# Patient Record
Sex: Male | Born: 1960 | Race: White | Hispanic: No | Marital: Married | Smoking: Never smoker
Health system: Southern US, Community
[De-identification: ages and names within clinical notes are randomized; demographics above are authoritative.]

## PROBLEM LIST (undated history)

## (undated) DIAGNOSIS — Z972 Presence of dental prosthetic device (complete) (partial): Secondary | ICD-10-CM

## (undated) DIAGNOSIS — H33313 Horseshoe tear of retina without detachment, bilateral: Secondary | ICD-10-CM

## (undated) DIAGNOSIS — R51 Headache: Secondary | ICD-10-CM

## (undated) DIAGNOSIS — Z87442 Personal history of urinary calculi: Secondary | ICD-10-CM

## (undated) DIAGNOSIS — M5416 Radiculopathy, lumbar region: Secondary | ICD-10-CM

## (undated) DIAGNOSIS — M1990 Unspecified osteoarthritis, unspecified site: Secondary | ICD-10-CM

## (undated) DIAGNOSIS — R55 Syncope and collapse: Secondary | ICD-10-CM

## (undated) DIAGNOSIS — F419 Anxiety disorder, unspecified: Secondary | ICD-10-CM

## (undated) DIAGNOSIS — S0590XA Unspecified injury of unspecified eye and orbit, initial encounter: Secondary | ICD-10-CM

## (undated) DIAGNOSIS — E871 Hypo-osmolality and hyponatremia: Secondary | ICD-10-CM

## (undated) DIAGNOSIS — G5602 Carpal tunnel syndrome, left upper limb: Secondary | ICD-10-CM

## (undated) DIAGNOSIS — R519 Headache, unspecified: Secondary | ICD-10-CM

## (undated) DIAGNOSIS — K319 Disease of stomach and duodenum, unspecified: Secondary | ICD-10-CM

## (undated) HISTORY — DX: Anxiety disorder, unspecified: F41.9

## (undated) HISTORY — PX: REFRACTIVE SURGERY: SHX103

---

## 1993-04-09 DIAGNOSIS — Z87442 Personal history of urinary calculi: Secondary | ICD-10-CM

## 1993-04-09 HISTORY — DX: Personal history of urinary calculi: Z87.442

## 1993-04-09 HISTORY — PX: CYSTOSCOPY W/ URETERAL STENT PLACEMENT: SHX1429

## 2006-01-09 ENCOUNTER — Emergency Department (HOSPITAL_COMMUNITY): Admission: EM | Admit: 2006-01-09 | Discharge: 2006-01-09 | Payer: Self-pay | Admitting: Emergency Medicine

## 2007-04-10 DIAGNOSIS — E871 Hypo-osmolality and hyponatremia: Secondary | ICD-10-CM

## 2007-04-10 DIAGNOSIS — K319 Disease of stomach and duodenum, unspecified: Secondary | ICD-10-CM

## 2007-04-10 HISTORY — DX: Hypo-osmolality and hyponatremia: E87.1

## 2007-04-10 HISTORY — DX: Disease of stomach and duodenum, unspecified: K31.9

## 2008-01-18 ENCOUNTER — Emergency Department (HOSPITAL_COMMUNITY): Admission: EM | Admit: 2008-01-18 | Discharge: 2008-01-18 | Payer: Self-pay | Admitting: Family Medicine

## 2008-01-18 ENCOUNTER — Emergency Department (HOSPITAL_COMMUNITY): Admission: EM | Admit: 2008-01-18 | Discharge: 2008-01-18 | Payer: Self-pay | Admitting: Emergency Medicine

## 2008-01-25 ENCOUNTER — Inpatient Hospital Stay (HOSPITAL_COMMUNITY): Admission: EM | Admit: 2008-01-25 | Discharge: 2008-01-29 | Payer: Self-pay | Admitting: Emergency Medicine

## 2008-01-25 ENCOUNTER — Ambulatory Visit: Payer: Self-pay | Admitting: *Deleted

## 2008-01-26 ENCOUNTER — Ambulatory Visit: Payer: Self-pay | Admitting: *Deleted

## 2010-01-08 IMAGING — CR DG ABDOMEN ACUTE W/ 1V CHEST
3 series · 3 of 3 positions shown · non-contrast
Comparison: Abdominal CT and acute abdominal series done earlier
today

CLINICAL DATA: Abdominal pain.  Constipation.

ACUTE ABDOMEN SERIES (ABDOMEN 2 VIEW & CHEST 1 VIEW)

[w chest pa]
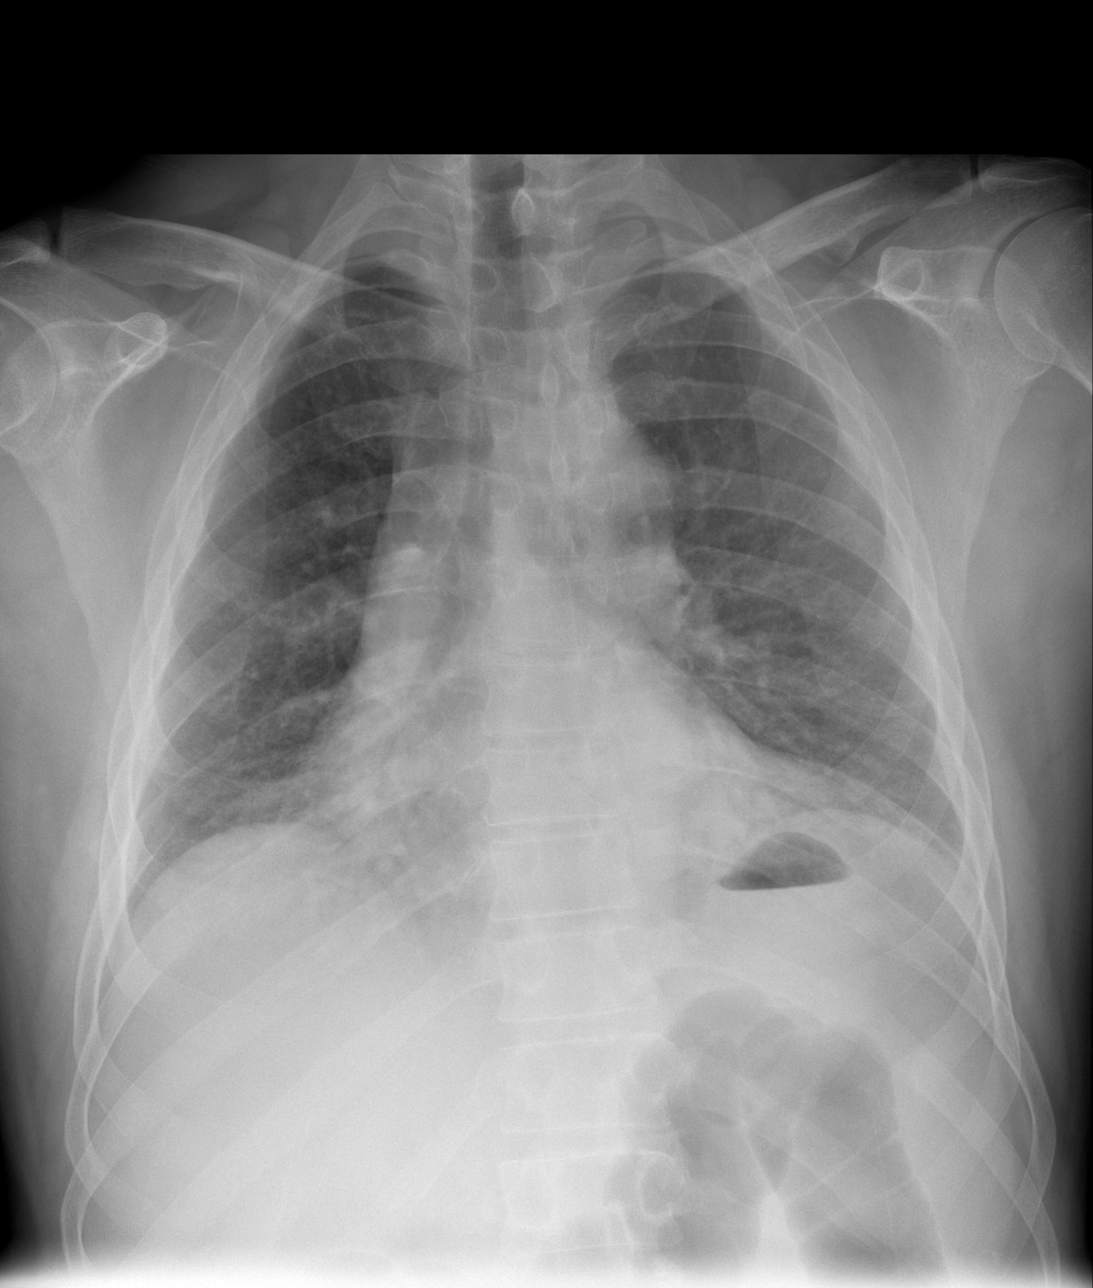

[w abdomen upright]
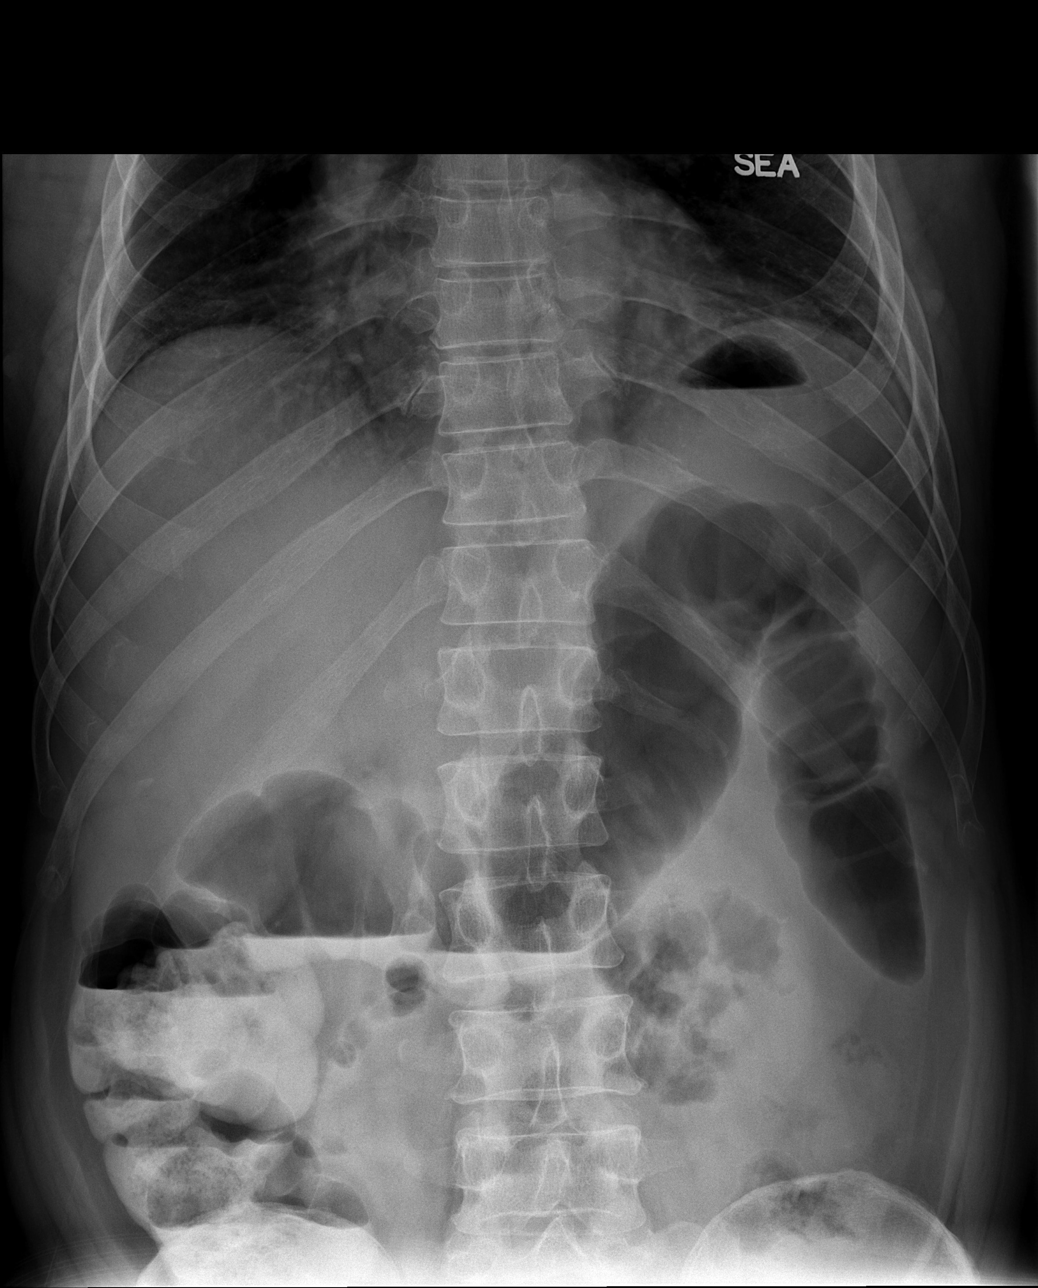

[t abdomen supine]
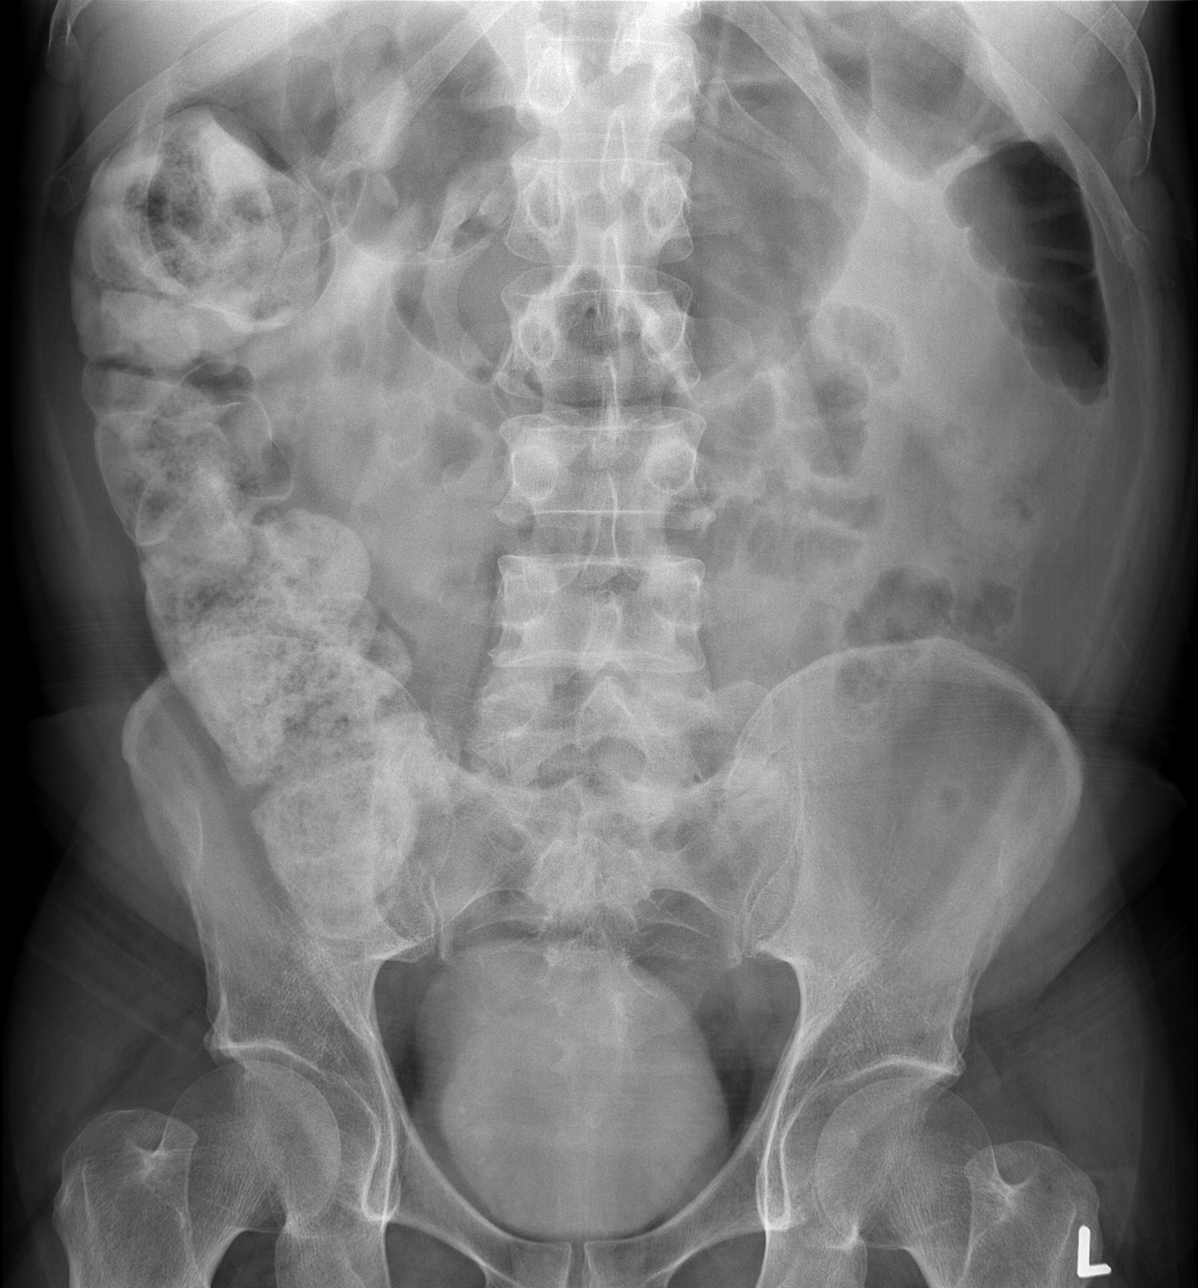

[3 of 3 positions shown; findings below may reference images not displayed]

FINDINGS: Frontal examination of the chest demonstrates stable
aortic ectasia and bibasilar atelectasis.

Supine and erect views of the abdomen demonstrate a nonobstructive
bowel gas pattern.  There is contrast material and stool in the
right colon.  There is contrast material in the urinary bladder.
There is no free intraperitoneal air.
IMPRESSION: No acute cardiopulmonary or abdominal process demonstrated.  Mild
bibasilar atelectasis.

## 2010-08-17 ENCOUNTER — Emergency Department (HOSPITAL_COMMUNITY)
Admission: EM | Admit: 2010-08-17 | Discharge: 2010-08-18 | Disposition: A | Discharge status: Home / Self Care | Payer: BC Managed Care – PPO | Attending: Emergency Medicine | Admitting: Emergency Medicine

## 2010-08-17 DIAGNOSIS — M79609 Pain in unspecified limb: Secondary | ICD-10-CM | POA: Insufficient documentation

## 2010-08-17 DIAGNOSIS — M5430 Sciatica, unspecified side: Secondary | ICD-10-CM | POA: Insufficient documentation

## 2010-08-17 DIAGNOSIS — M545 Low back pain, unspecified: Secondary | ICD-10-CM | POA: Insufficient documentation

## 2010-08-22 NOTE — Consult Note (Signed)
NAMETEAGEN, MCLEARY NO.:  [id number]   MEDICAL RECORD NO.:  [id number]          PATIENT TYPE:  INP   LOCATION:  6704                         FACILITY:  [facility]   PHYSICIAN:  Ardeth Sportsman, MD     DATE OF BIRTH:  1961-02-16   DATE OF CONSULTATION:  DATE OF DISCHARGE:                                 CONSULTATION   PRIMARY CARE PHYSICIAN:  Dr. Nicholos Johns with [facility].   CONSULTING PHYSICIAN:  Zadie Rhine, MD including [facility]  Emergency Department.   REASON FOR CONSULTATION:  Abdominal pain, nausea, vomiting, question  constipation.   HISTORY OF PRESENT ILLNESS:  [name] is a 50 year old male,  otherwise healthy with no history of any GI problems in the past to  about a week ago, had a 10/10 severe abdominal pain.  He came to the  emergency room last week.  A workup including a CAT scan and  laboratories values was negative.  He was given an oral laxative and had  large bowel movement, seemed to feel a little bit better.  Based on  improvement, he was sent home.  However, over the past week he started  having abdominal pain again.  He has been feeling nauseated  intermittently and thrown off.  He cannot recall if he has had a  significant bowel movement in the past week.  He went to an Urgent Care  Center.   The patient denies any dysphagia to solids or liquids.  No hematochezia,  hematemesis, or melena.  No history of heartburn or reflux.  No history  of sick contacts or travel history.  He is not having any really  significant change in diet.  Normally, he can eat anything he wants  without any evidence of postprandial pain or difficulty.  No history of  ulcers.  No burning or pain.  Not a heavy drinker at all.   Evaluation was concerning, and therefore, he came to the emergency  department and pain consultation was requested for possible surgical  etiology for his problems.   PAST MEDICAL HISTORY:  Negative.   PAST SURGICAL  HISTORY:  Negative.   ALLERGIES:  None.   MEDICATIONS:  He is just on glycerine and Dulcolax suppositories for the  past week, otherwise negative.   SOCIAL HISTORY:  No tobacco, alcohol, or drug use.  He is married and  lives with his family.   FAMILY HISTORY:  Diabetes and cancer, but no GI malignancies or cancers.  No family history of inflammatory bowel disease or irritable bowel  syndrome.   REVIEW OF SYSTEMS:  As noted per HPI.  CONSTITUTIONAL:  No fevers, chills, or sweats.  Otherwise, he had  decreased appetite.  No changes in weight.  PSYCHIATRIC/EYES/ENT/  CARDIAC/RESPIRATORY:  Negative.  HEME/LYMPH:  Allergic.  HEPATIC/RENAL/  ENDOCRINE:  Otherwise negative.  GI:  As noted per HPI.  MUSCULOSKELETAL/NEUROLOGIC:  Negative.  DERMATOLOGY:  Negative.   PHYSICAL EXAMINATION:  VITAL SIGNS:  Temperature 98.7, respirations 18,  pulses in 110s, and blood pressure 150-170s systolic over 88-104.  GENERAL:  He is a well-developed  obese male lying in bed.  In no acute  distress.  PSYCHIATRIC:  He just received some narcotics, so he is a little bit  confused when he answered, but after talking to him a couple of minutes  he seemed to be oriented x4.  No evidence of any dementia, delirium,  psychosis, or paranoia.  HEENT:  Eyes, his pupils are equal, round, and reactive to light.  Extraocular movements intact.  Sclerae slightly injected but not  icteric.  Normocephalic with no facial asymmetry.  Mucous membranes are  moist.  Nasopharynx and oropharynx clear.  NECK:  Supple.  No masses.  Trachea is midline.  LYMPH:  No head, neck, axillary, or groin lymphadenopathy.  HEART:  Regular rate and rhythm.  No murmurs, rubs, or gallops.  CHEST:  Clear to auscultation bilaterally.  No wheezes, rales, or  rhonchi.  No pain to rub or sternal compression.  ABDOMEN:  Obese but soft.  He has no focal tenderness to my exam.  When  I asked him, he kind of points to just all around his abdomen.  He   cannot give me a pinpoint area.  There is no Murphy sign.  He may have a  2-mm hernia within his umbilical stalk.  I think it is more physiologic  than a true hernia.  He has no guarding, no Rovsing sign or obturator  sign.  No pain to percussion or bed shake.  GENITOURINARY:  Normal external genitalia.  The testes descended  bilaterally.  He has a little fullness at his rings, but no definite  inguinal hernias.  RECTAL:  Refused.  EXTREMITIES:  No clubbing, cyanosis, or edema with normal radial and  dorsalis pedis pulses.  MUSCULOSKELETAL:  Full range of motion of the shoulders, elbows, wrists,  as well as hip, knees, and ankles.  BACK:  No pain on cervicothoracolumbosacral spine.  SKIN:  No petechiae.  No purpura.  No other source of lesions.   LABORATORY EVALUATION:  Sodium 117, electrolytes otherwise in normal  range.  Liver function tests amylase and lipase are normal.  His white  count is 11.9 which is up from 8 before, hemoglobin 16.4.   CT scan of the abdomen and pelvis shows a stable liver hemangioma.  There is no evidence of any bowel obstruction.  Contrast does get down  into the distal small bowel, but it has not reached in the colon yet.  The right colon still has some stool in it but the colon wall does not  appear to be thickened.  Left side of the transverse colon is dilated  with air, but again nonthickened.  The descending colon down to the  sigmoid is decompressed.  There is no obvious transition zone.  He has  no umbilical hernia.  Pelvis, no evidence of any diverticulitis or  discrete masses or lymphadenopathy.  He may have a little fullness along  his left inguinal canal, but hard to say that he truly has an inguinal  hernia.   ASSESSMENT AND PLAN:  A 50 year old obese male with intermittent  abdominal pain and some constipation, possible obstipation, nausea,  vomiting of uncertain etiology.   1. At this point, there is nothing surgically that is  definitively      going on but we will follow the patient.  I agree with the ER that      the patient needs to be admitted.  Dr. Flonnie Overman with [facility]      Hospitalists to evaluate and consider  admission, which I think is      reasonable given the severe hyponatremia.  2. Hyponatremia, we will refer to supplementation to follow.  3. Serial abdominal exams.  Consider upper and lower endoscopy to rule      out some type of colitis or other abnormality given the odd      symptoms.  4. Consider fever culture workup to see if perhaps he has a mild ileus      related to this.  5. Follow mental status on narcotics.  There is no evidence of any      psych issues on this patient, but he is not fully      with it right now.  We will follow.  I do not think he has strong      evidence of delirium but we will follow.  6. Hypertension and tachycardia most likely secondary to pain and      discomfort.  Follow and refer to medicine service.      Ardeth Sportsman, MD  Electronically Signed     SCG/MEDQ  D:  01/25/2008  T:  01/26/2008  Job:  [id number]   cc:   Sunny Schlein, PA, [facility]  Elana Alm. Nicholos Johns, M.D.  Lucita Ferrara, MD

## 2010-08-22 NOTE — H&P (Signed)
NAMELESLIE, [name]:  [id number]   MEDICAL RECORD NO.:  [id number]          PATIENT TYPE:  INP   LOCATION:  6704                         FACILITY:  [facility]   PHYSICIAN:  Lucita Ferrara, MD         DATE OF BIRTH:  [birth year]   DATE OF ADMISSION:  01/25/2008  DATE OF DISCHARGE:                              HISTORY & PHYSICAL   PRIMARY CARE DOCTOR:  Dr. Janace Litten at [facility].   The patient is a 50 year old male who presents to [facility] with a chief complaint of diffuse acute abdominal pain and  abdominal distention.  The patient had acute abdominal series suggestive  of colonic ileus versus obstruction.  His abdominal pain really started  a week ago and has been progressively getting worse to the point where  he just cannot any tolerate p.o. Nonradiating. Really located diffusely.  Currently it is 2/10 in intensity now. It was 10/10 in intensity. And he  denies any loose bloody dark stools.  He denies any vomiting.  He denies  any fevers, chills. He tried some laxatives without any relief.  He was  evaluated by surgical team, Dr. Michaell Cowing, here in the emergency room and  currently he has not had a surgical abdomen per surgical consultation.  He denies any chest pain, or shortness of breath.   ALLERGIES:  No known drug allergies.   MEDICATIONS AT HOME:  Include Dulcolax as needed and laxatives over-the-  counter as needed.   REVIEW OF SYSTEMS:  As per HPI, otherwise negative.   PAST MEDICAL HISTORY:  None.   PAST SURGICAL HISTORY:  None.   SOCIAL HISTORY:  Denies drugs, alcohol or tobacco. He lives with his  spouse.   PHYSICAL EXAMINATION:  GENERAL:  Patient is in acute discomfort and  distress.  HEENT: Normocephalic, atraumatic.  Sclerae are anicteric.  Mucous  membranes are dry.  NECK: Supple.  CARDIOVASCULAR:  S1-S2. Tachycardiac.  There are no murmurs, rubs or  clicks.  ABDOMEN:  Diffusely distended.  There are hypoactive bowel  sounds.  EXTREMITIES:  There is no clubbing, cyanosis or edema.  NEURO:  Patient is anxious but alert and oriented x3. Cranial nerves  grossly intact.   The patient was evaluated in the emergency room by surgical service of  Dr. Michaell Cowing who has determined the patient will likely not need acute  surgery now and to admit to medicine for CT scan of the abdomen and  pelvis.  Abdomen shows a 2 cm enhancing lesion in the dome of the left  lobe of the liver, consist with hemangioma. No dilated loops of bowel.  Moderate amount of stool in the ascending colon with a moderate amount  of air in the transverse portion of the colon.  There is no obstruction,  free air, or free fluid.  Pelvis, distal colon normal. There is no free  fluid or adenopathy, ileum normal.  No diverticular disease.   LABORATORY DATA:  Lipase 18. Complete metabolic panel shows sodium 117,  potassium 4.6, chloride 85, CO2 is 20, BUN 9, creatinine 0.63  AST, ALT  15 and  71 respectively.  Calcium is 8.6.  White count 11.9.  Hemoconcentrated hemoglobin at 16.4, hematocrit 47.9, platelets of 315.   ASSESSMENT/PLAN:  The patient is a 50 year old with diffuse abdominal  pain. Although his abdomen is distended his CT scan is nonspecific and  does not show an acute abdomen.  The patient was evaluated surgical  service in the emergency room, and he has severe hyponatremia, probably  from decreased p.o. intake given he is hypochloremic as well.  Will go  ahead and admit the patient to the medical telemetry unit.  Will  initiate IV hydration gently to bring his sodium to normal range slowly.  Urine lytes, urinalysis. Will keep the patient n.p.o. and on bowel rest.  The rest of plans are dependent on his progress and consultant  recommendations.      Lucita Ferrara, MD  Electronically Signed     RR/MEDQ  D:  01/25/2008  T:  01/26/2008  Job:  [id number]

## 2010-08-22 NOTE — Discharge Summary (Signed)
NAMEMATHAN, Robert [name]:  [id number]   MEDICAL RECORD NO.:  [id number]          PATIENT TYPE:  INP   LOCATION:  5154                         FACILITY:  [facility]   PHYSICIAN:  Ramiro Harvest, MD    DATE OF BIRTH:  Jul 09, 1960   DATE OF ADMISSION:  01/25/2008  DATE OF DISCHARGE:  01/29/2008                               DISCHARGE SUMMARY   PRIMARY CARE PHYSICIAN:  Dr. Janace Litten with [facility].   DISCHARGE DIAGNOSES:  1. Colonic ileus.  2. Hyponatremia.  3. Constipation.  4. Dehydration.   DISCHARGE MEDICATIONS:  None.   DISPOSITION:  On followup, the patient will be discharged home.  The  patient is to call to schedule a followup appointment with his PCP in 2  weeks.  On followup, the basic metabolic profile need to be checked to  follow up on the patient's sodium and hyponatremia.   CONSULTATIONS:  A General Surgery consult was obtained on January 25, 2008.  The patient was seen in consultation by Dr. Michaell Cowing of [facility].   PROCEDURES PERFORMED.:  1. A CT of the abdomen and pelvis was performed on January 25, 2008,      which showed a benign appearing abdomen, slightly increased stool      and air in the colon, hemangioma in the left lobe of the liver, not      felt to be significant.  CT of the pelvis showed a benign appearing      pelvis.  CT of the head without contrast was done on January 26, 2008, that was negative for bleed or other acute intracranial      process.  Abdominal films were done on January 27, 2008, that      showed stable, mild gaseous distention of the bile both small bowel      and colon, oral contrast material was present in the colon.  Acute      abdominal series was done on January 28, 2008, which showed      interval improvement with decrease in bowel distention.   BRIEF ADMISSION HISTORY AND PHYSICAL:  Mr. Nichelson is a 50 year old  gentleman who presented to [facility] with a chief  complaint of diffuse acute abdominal pain and abdominal distention.  The  patient had an acute abdominal series suggested of colonic ileus versus  obstruction.  His abdominal pain restarted a week prior to admission and  had been progressively getting worse the point where he just could not  tolerate any p.o.  Pain was nonradiating, located diffusely, was 2/10 in  intensity per admitting physician and then went up to 10/10.  The  patient denied any loose black or dark stools, denied any emesis, denied  any fevers, no chills.  The patient tried some laxatives at home without  any relief.  He was evaluated by the surgical team, Dr. Michaell Cowing in the  emergency department and currently they felt the patient did not have a  surgical abdomen per surgical consultation.  The patient denied any  chest pain or  shortness of breath.   PHYSICAL EXAMINATION:  GENERAL:  The patient was in acute discomfort and  distress.  HEENT: Normocephalic, atraumatic.  Anicteric sclera.  Mucous membranes  were dry.  NECK:  Supple.  No lymphadenopathy.  CARDIOVASCULAR:  Tachycardiac, S1 and S2, no murmurs, rubs or gallops.  ABDOMEN:  Diffusely distended, hypoactive bowel sounds.  EXTREMITIES:  No clubbing, cyanosis, or edema.  NEUROLOGICAL:  The patient was anxious, but alert and oriented x3.  Cranial nerves II through XII were grossly.   The patient was evaluated in the emergency room by the surgical service,  Dr. Michaell Cowing.  It was determined that the patient was not likely in need of  any acute surgery and the patient needed to be admitted to the medicine  floor for CT scan of the abdomen and pelvis.  Abdomen did show a 2-cm  enhancing lesion in the dome of the left lobe of the liver which was  consistent with a hemangioma.  No dilated loops of bowel, moderate  amount of stool in the ascending colon with a moderate amount of air in  the transverse portion of the colon, no obstruction, free of fluid.  The  pelvis did  have a normal distal colon, no free fluid or adenopathy.  Ileum was normal and no diverticular disease.   ADMISSION LABORATORY:  Lipase 18, sodium 117, potassium 4.6, chloride  85, bicarb 20, BUN 9, creatinine 0.63, AST was 15, ALT was 71, calcium  of 8.6.  CBC white count of 11.9, hemoglobin of 16.4, hematocrit of  47.9, and platelets of 315.   HOSPITAL COURSE:  1. The patient was admitted to the medicine service.  The patient was      placed on bowel rest, was hydrated with IV fluids placed on a PPI.      Serial abdominal films were obtained.  The patient was given some      supportive care.  The patient was placed on an MRI and Reglan was      started and also the patient was placed on Colace.  The patient was      monitored throughout the hospitalization with gradual improvement      in his symptoms.  The patient was then transitioned to a clear      liquid diet and then to a full liquid diet which the patient was      able to tolerate without any emesis or nausea.  The patient did      start to have flatus and then did start to have bowel movements.      The patient's abdominal distention improved.  The patient's      abdominal pain resolved.  The patient was followed with serial      abdominal films which showed some improvement as stated above in      terms of interval improvement in bowel dilatation.  The patient      continued to improve on a daily basis and by day of discharge, the      patient was in stable and improved condition and back to his      baseline.  The patient was passing gas.  The patient was having      bowel movements and was able to tolerate regular diet.  The patient      will be discharged home in stable and improved condition to follow      up with PCP as stated above.  2. Hyponatremia.  The patient  was noted on admission to be      hyponatremic with a sodium of 117, lipase levels were all obtained      which came back negative.  Urine sodium was obtained  which came      back at 56, magnesium level was obtained which came back at 2.3, a      TSH level came back at 3.26 which was within normal limits.  Head      CT done was negative.  It was felt the patient's hyponatremia was      likely secondary to hypovolemic hyponatremia secondary to      dehydration.  The patient was hydrated gently with IV fluids.  The      patient's sodium was followed throughout the hospitalization and by      day of discharge, the patient's sodium had improved from 117-134.      The patient remained asymptomatic, did not have any episodes of      seizures during the hospitalization and the patient will be      discharged in stable and improved condition to follow up with PCP      in 2 weeks.  On followup, the patient will need basic metabolic      profile to follow up on his hyponatremia.  The patient will be      discharged in stable and improved condition.  3. Dehydration.  The patient seemed dehydrated on clinical exam on      admission.  The patient was also noted to be hemoconcentrated as he      did have an elevated hemoglobin on admission.  The patient was      hydrated with IV fluids and the patient was subsequently volume      resuscitated, such that the patient's dehydration had resolved by      day of discharge.  Constipation resolved during the      hospitalization.  The patient was placed on an enema as well as      Reglan with improvement in the patient's colonic ileus.  The      patient's constipation resolved and the patient will be discharged      home in stable and improved condition on day of discharge.   DISCHARGE LABORATORY:  Magnesium of 2.3, FOBT was negative.  Sodium 134,  potassium 3.8, chloride 101, bicarb 24, BUN 4, creatinine 0.96, glucose  of 105, and a calcium of 8.7.  It was a pleasure taking care of Mr.  Robert Andersen.      Ramiro Harvest, MD  Electronically Signed     DT/MEDQ  D:  01/29/2008  T:  01/30/2008  Job:   [id number]   cc:   Dr. Janace Litten

## 2011-01-09 LAB — COMPREHENSIVE METABOLIC PANEL
AST: 15
AST: 18
Albumin: 3.6
Alkaline Phosphatase: 65
Alkaline Phosphatase: 78
BUN: 9
CO2: 20
CO2: 23
Calcium: 8.6
Chloride: 94 — ABNORMAL LOW
Creatinine, Ser: 0.63
GFR calc Af Amer: >60
GFR calc Af Amer: >60
GFR calc non Af Amer: >60
GFR calc non Af Amer: >60
Glucose, Bld: 109 — ABNORMAL HIGH
Glucose, Bld: 96
Potassium: 4.2
Potassium: 4.6
Sodium: 129 — ABNORMAL LOW
Total Bilirubin: 0.8
Total Bilirubin: 1
Total Protein: 6.5
Total Protein: 6.6
Total Protein: 6.8

## 2011-01-09 LAB — URINALYSIS, ROUTINE W REFLEX MICROSCOPIC
Bilirubin Urine: NEGATIVE
Bilirubin Urine: NEGATIVE
Bilirubin Urine: NEGATIVE
Glucose, UA: 100 — AB
Glucose, UA: NEGATIVE
Ketones, ur: 15 — AB
Ketones, ur: 15 — AB
Ketones, ur: 15 — AB
Leukocytes, UA: NEGATIVE
Nitrite: NEGATIVE
Nitrite: NEGATIVE
Protein, ur: 100 — AB
Specific Gravity, Urine: 1.017
Specific Gravity, Urine: 1.033 — ABNORMAL HIGH
Urobilinogen, UA: 0.2
Urobilinogen, UA: 0.2
pH: 6.5
pH: 6.5

## 2011-01-09 LAB — COMPREHENSIVE METABOLIC PANEL WITH GFR
ALT: 24
AST: 19
Albumin: 4.1
Alkaline Phosphatase: 57
BUN: 8
CO2: 23
Calcium: 8.8
Chloride: 98
Creatinine, Ser: 0.71
GFR calc non Af Amer: >60
Glucose, Bld: 123 — ABNORMAL HIGH
Potassium: 3.8
Sodium: 129 — ABNORMAL LOW
Total Bilirubin: 0.7
Total Protein: 6.8

## 2011-01-09 LAB — POCT I-STAT, CHEM 8
BUN: 7
Calcium, Ion: 1.17
Chloride: 96
Creatinine, Ser: 1
Glucose, Bld: 139 — ABNORMAL HIGH
HCT: 51
Hemoglobin: 17.3 — ABNORMAL HIGH
Potassium: 3.7
Sodium: 134 — ABNORMAL LOW
TCO2: 26

## 2011-01-09 LAB — CBC
HCT: 47.8
HCT: 48.1
HCT: 48.5
Hemoglobin: 16.9
Hemoglobin: 16.9
MCHC: 34.2
MCV: 87.9
Platelets: 183
Platelets: 355
RBC: 5.45
RBC: 5.61
RDW: 12.5
RDW: 12.7
RDW: 12.7
WBC: 10
WBC: 8.3
WBC: 8.5

## 2011-01-09 LAB — DIFFERENTIAL
Basophils Absolute: 0
Basophils Absolute: 0
Basophils Absolute: 0
Basophils Relative: 0
Basophils Relative: 0
Basophils Relative: 1
Eosinophils Absolute: 0
Eosinophils Absolute: 0
Eosinophils Absolute: 0
Eosinophils Relative: 0
Eosinophils Relative: 0
Eosinophils Relative: 0
Lymphocytes Relative: 21
Lymphocytes Relative: 23
Lymphs Abs: 1.7
Lymphs Abs: 2
Monocytes Absolute: 0.5
Monocytes Absolute: 0.6
Monocytes Relative: 6
Monocytes Relative: 7
Neutro Abs: 5.9
Neutro Abs: 6
Neutrophils Relative %: 70
Neutrophils Relative %: 73

## 2011-01-09 LAB — URINE MICROSCOPIC-ADD ON

## 2011-01-09 LAB — BASIC METABOLIC PANEL
BUN: 4 — ABNORMAL LOW
BUN: 9
CO2: 26
Calcium: 8.9
Chloride: 101
Creatinine, Ser: 0.96
GFR calc non Af Amer: >60
GFR calc non Af Amer: >60
Glucose, Bld: 101 — ABNORMAL HIGH
Glucose, Bld: 105 — ABNORMAL HIGH

## 2011-01-09 LAB — LIPASE, BLOOD
Lipase: 14
Lipase: 18

## 2011-01-09 LAB — CK TOTAL AND CKMB (NOT AT ARMC): Total CK: 77

## 2011-01-09 LAB — MAGNESIUM
Magnesium: 2.3
Magnesium: 2.3

## 2011-01-09 LAB — CULTURE, BLOOD (ROUTINE X 2): Culture: NO GROWTH

## 2011-01-09 LAB — TSH: TSH: 3.264

## 2015-04-07 ENCOUNTER — Ambulatory Visit
Admission: RE | Admit: 2015-04-07 | Discharge: 2015-04-07 | Disposition: A | Discharge status: Home / Self Care | Payer: BLUE CROSS/BLUE SHIELD | Source: Ambulatory Visit | Attending: Cardiology | Admitting: Cardiology

## 2015-04-07 ENCOUNTER — Other Ambulatory Visit: Payer: Self-pay | Admitting: Cardiology

## 2015-04-07 DIAGNOSIS — R079 Chest pain, unspecified: Secondary | ICD-10-CM

## 2015-04-10 DIAGNOSIS — R55 Syncope and collapse: Secondary | ICD-10-CM

## 2015-04-10 HISTORY — PX: EYE SURGERY: SHX253

## 2015-04-10 HISTORY — DX: Syncope and collapse: R55

## 2015-05-19 ENCOUNTER — Encounter: Payer: Self-pay | Admitting: Neurology

## 2015-05-19 ENCOUNTER — Ambulatory Visit (INDEPENDENT_AMBULATORY_CARE_PROVIDER_SITE_OTHER): Payer: BLUE CROSS/BLUE SHIELD | Admitting: Neurology

## 2015-05-19 ENCOUNTER — Other Ambulatory Visit: Payer: Self-pay | Admitting: Neurology

## 2015-05-19 VITALS — BP 110/80 | HR 84 | Ht 68.0 in | Wt 205.4 lb

## 2015-05-19 DIAGNOSIS — R51 Headache: Secondary | ICD-10-CM

## 2015-05-19 DIAGNOSIS — R202 Paresthesia of skin: Secondary | ICD-10-CM

## 2015-05-19 DIAGNOSIS — R519 Headache, unspecified: Secondary | ICD-10-CM

## 2015-05-19 DIAGNOSIS — R209 Unspecified disturbances of skin sensation: Secondary | ICD-10-CM

## 2015-05-19 MED ORDER — NORTRIPTYLINE HCL 10 MG PO CAPS: ORAL_CAPSULE | ORAL | Status: DC

## 2015-05-19 NOTE — Progress Notes (Signed)
[facility] Clinic Note - Initial Visit   Date: 05/19/2015  DOSSIE HATCHER MRN: [id number] DOB: 06-03-1960   Dear Dr. Moreen Fowler:  Thank you for your kind referral of Robert Andersen for consultation of generalized paresthesias. Although his history is well known to you, please allow [place] to reiterate it for the purpose of our medical record. The patient was accompanied to the clinic by wife who also provides collateral information.     History of Present Illness: Robert Andersen is a 55 y.o. right-handed Caucasian male with anxiety presenting for evaluation of left sided paresthesias, fall, and headaches.    Starting in early December 2016, he woke up complete bilateral arm and hand numbness which lasted a few hours but has been persistent since then, lasting 1-2 days and occuring several times per week.  Denies any triggers, exacerbating factors.  Two weeks later, he had fallen asleep on his sofa after taking ambien and when he woke up 3 hours later, realized the lights were still on so turned the lights off.  He suffered a fall and was found by his wife awake, but groggy, and able to walk to the bedroom to sleep.  He has been on [place] since 2009.  There was no abnormal movements, tongue biting, or urinary/bowel incontinence.  On December 21st, he woke up with the left side of the face numbness which has been constant, however, over the past week, facial numbness is slowly improving, only affecting a small area of his lip on the left.  He continues to have numbness over the left arm and leg which come and goes.  He complains of weakness of his grip in both hands.  He endorses dropping objects.   He complains blurry vision, no double vision.  He has not had his eyes checked in several years.    He also has two month history of headaches, worse at the vertex, and described as pounding.  No photophobia, phonophobia, nausea, and vomiting.  Headaches usually lasts 6 hours  and are relieved with aspirin, occuring about 3-4 times per week.  He takes OTC medication for headaches 3-4 times per week.   His father passed away in 10-Aug-2012 and takes xanax for anxiety related to this.  Out-side paper records, electronic medical record, and images have been reviewed where available and summarized as:  Labs 04/07/2015:  CMP, CBC normal, LDL 87, TSH 1.883   Past Medical History  Diagnosis Date  . Kidney stone   . Anxiety    Past Surgical History:  None   Medications:  Outpatient Encounter Prescriptions as of 05/19/2015  Medication Sig Note  . ALPRAZolam (XANAX) 1 MG tablet TAKE 1 TABLET BY MOUTH DAILY AS NEEDED FOR STRESS 05/19/2015: Received from: External Pharmacy  . zolpidem (AMBIEN) 10 MG tablet Take 10 mg by mouth at bedtime. 05/19/2015: Received from: External Pharmacy  . nortriptyline (PAMELOR) 10 MG capsule Take 1 tablet at bedtime.    No facility-administered encounter medications on file as of 05/19/2015.     Allergies: No Known Allergies  Family History: Family History  Problem Relation Age of Onset  . Colon cancer Mother   . Heart attack Father   . Diabetes Father   . Healthy Son     Social History: Social History  Substance Use Topics  . Smoking status: Never Smoker   . Smokeless tobacco: Never Used  . Alcohol Use: No   Social History   Social History Narrative  Lives with wife in a one story home.  Has 1 child.     Works as a Building control surveyor, Furniture conservator/restorer, Games developer.     Education: high school.    Review of Systems:  CONSTITUTIONAL: No fevers, chills, night sweats, or weight loss.   EYES: +o visual changes or eye pain ENT: No hearing changes.  No history of nose bleeds.   RESPIRATORY: No cough, wheezing and shortness of breath.   CARDIOVASCULAR: Negative for chest pain, and palpitations.   GI: Negative for abdominal discomfort, blood in stools or black stools.  No recent change in bowel habits.   GU:  No history of incontinence.     MUSCLOSKELETAL: No history of joint pain or swelling.  No myalgias.   SKIN: Negative for lesions, rash, and itching.   HEMATOLOGY/ONCOLOGY: Negative for prolonged bleeding, bruising easily, and swollen nodes.  No history of cancer.   ENDOCRINE: Negative for cold or heat intolerance, polydipsia or goiter.   PSYCH:  +depression or anxiety symptoms.   NEURO: As Above.   Vital Signs:  BP 110/80 mmHg  Pulse 84  Ht 5\' 8"  (1.727 m)  Wt 205 lb 6 oz (93.157 kg)  BMI 31.23 kg/m2  SpO2 96% Pain Scale: 0 on a scale of 0-10   General Medical Exam:   General:  Well appearing, comfortable.   Eyes/ENT: see cranial nerve examination.   Neck: No masses appreciated.  Full range of motion without tenderness.  No carotid bruits. Respiratory:  Clear to auscultation, good air entry bilaterally.   Cardiac:  Regular rate and rhythm, no murmur.   Extremities:  No deformities, edema, or skin discoloration.  Skin:  No rashes or lesions.  Neurological Exam: MENTAL STATUS including orientation to time, place, person, recent and remote memory, attention span and concentration, language, and fund of knowledge is normal.  Blunted affect, poor eye contact. Speech is not dysarthric.  CRANIAL NERVES: II:  No visual field defects.  Unremarkable fundi.   III-IV-VI: Pupils equal round and reactive to light.  Normal conjugate, extra-ocular eye movements in all directions of gaze.  No nystagmus.  No ptosis.   V:  Normal facial sensation.  Jaw jerk is  absent.   VII:  Normal facial symmetry and movements.  No pathologic facial reflexes.  VIII:  Normal hearing and vestibular function.   IX-X:  Normal palatal movement.   XI:  Normal shoulder shrug and head rotation.   XII:  Normal tongue strength and range of motion, no deviation or fasciculation.  MOTOR:  No atrophy, fasciculations or abnormal movements.  No pronator drift.  Tone is normal.    Right Upper Extremity:    Left Upper Extremity:    Deltoid  5/5    Deltoid  5/5   Biceps  5/5   Biceps  5/5   Triceps  5/5   Triceps  5/5   Wrist extensors  5/5   Wrist extensors  5/5   Wrist flexors  5/5   Wrist flexors  5/5   Finger extensors  5/5   Finger extensors  5/5   Finger flexors  5/5   Finger flexors  5/5   Dorsal interossei  5/5   Dorsal interossei  5/5   Abductor pollicis  5/5   Abductor pollicis  5/5   Tone (Ashworth scale)  0  Tone (Ashworth scale)  0   Right Lower Extremity:    Left Lower Extremity:    Hip flexors  5/5   Hip flexors  5/5   Hip extensors  5/5   Hip extensors  5/5   Knee flexors  5/5   Knee flexors  5/5   Knee extensors  5/5   Knee extensors  5/5   Dorsiflexors  5/5   Dorsiflexors  5/5   Plantarflexors  5/5   Plantarflexors  5/5   Toe extensors  5/5   Toe extensors  5/5   Toe flexors  5/5   Toe flexors  5/5   Tone (Ashworth scale)  0  Tone (Ashworth scale)  0   MSRs:  Right                                                                 Left brachioradialis 1+  brachioradialis 1+  biceps 1+  biceps 1+  triceps 1+  triceps 1+  patellar 2+  patellar 2+  ankle jerk 1+  ankle jerk 1+  Hoffman no  Hoffman no  plantar response down  plantar response down   SENSORY:  Sensation to temperature is reduced over the left leg and there is a very localized region over the left pretibial area with reduced pin prick.  Otherwise, sensation to all modalities is intact.  Romberg's sign absent.   COORDINATION/GAIT: Normal finger-to- nose-finger and heel-to-shin.  Intact rapid alternating movements bilaterally.  Able to rise from a chair without using arms.  Gait narrow based and stable. Stressed gait intact.  He is unsteady with tandem gait.   IMPRESSION: Mr. Jervis is a 55 year-old gentleman with a constellation of neurological symptoms including left sided paresthesias (face, arm, leg), headache, weakness, and falls.  His exam shows reduced reflexes throughout without any sensory or motor deficits.  It is difficult to localize all  his symptoms and with his new history of headaches, it is prudent to exclude intracranial pathology such as demyelinating disease, so MRI brain will be ordered.     PLAN/RECOMMENDATIONS:  1.  MRI brain wwo contrast 2.  Check Vitamin B12  3.  Start nortriptyline 10mg  at bedtime - uptitrate as needed 4.  Consider NCS/EMG of the left side going forward  Return to clinic in 2-3 months.   The duration of this appointment visit was 35 minutes of face-to-face time with the patient.  Greater than 50% of this time was spent in counseling, explanation of diagnosis, planning of further management, and coordination of care.   Thank you for allowing me to participate in patient's care.  If I can answer any additional questions, I would be pleased to do so.    Sincerely,    Donika K. Posey Pronto, DO

## 2015-05-19 NOTE — Patient Instructions (Signed)
1.  MRI brian wwo contrast 2.  Check vitamin B12  3.  Start nortripyline 10mg  at bedtime.  While starting this medication, stop the [place] as both can make you sleepy until you know how the medication affects you. 4.  We may need to do electrodiagnostic testing of the arm and leg if your symptoms do not improve  Return to clinic in 2-3 months

## 2015-05-19 NOTE — Progress Notes (Signed)
Note routed

## 2015-05-20 ENCOUNTER — Telehealth: Payer: Self-pay | Admitting: *Deleted

## 2015-05-20 NOTE — Progress Notes (Signed)
Addendum  Labs 05/19/2015:  Vitamin B12  396 - normal

## 2015-05-20 NOTE — Telephone Encounter (Signed)
Patient is not on mychart.

## 2015-05-23 NOTE — Progress Notes (Signed)
Left message informing patient.

## 2015-05-27 ENCOUNTER — Telehealth: Payer: Self-pay | Admitting: Neurology

## 2015-05-27 NOTE — Telephone Encounter (Signed)
Pt wife Kalman Shan called and states that the medication is not working and would like to speak to someone please call her at [phone] or [phone] ext [phone]

## 2015-05-27 NOTE — Telephone Encounter (Signed)
I spoke with patient's wife and she said that the patient is taking the nortriptyline, xanax and 1/2 Lorrin Mais and is still not able to sleep.  Instructed her to have patient take the nortriptyline in the morning per Dr. Posey Pronto and call the MD that ordered the ambien to see if patient can take a whole one.  She agreed with plan.

## 2015-05-30 ENCOUNTER — Ambulatory Visit
Admission: RE | Admit: 2015-05-30 | Discharge: 2015-05-30 | Disposition: A | Discharge status: Home / Self Care | Payer: BLUE CROSS/BLUE SHIELD | Source: Ambulatory Visit | Attending: Neurology | Admitting: Neurology

## 2015-05-30 DIAGNOSIS — R519 Headache, unspecified: Secondary | ICD-10-CM

## 2015-05-30 DIAGNOSIS — R209 Unspecified disturbances of skin sensation: Secondary | ICD-10-CM

## 2015-05-30 DIAGNOSIS — R51 Headache: Secondary | ICD-10-CM

## 2015-05-30 DIAGNOSIS — R202 Paresthesia of skin: Secondary | ICD-10-CM

## 2015-05-30 MED ORDER — GADOBENATE DIMEGLUMINE 529 MG/ML IV SOLN
20.0000 mL | Freq: Once | INTRAVENOUS | Status: AC | PRN
  Administered 2015-05-30: 20 mL via INTRAVENOUS

## 2015-06-02 ENCOUNTER — Telehealth: Payer: Self-pay | Admitting: Neurology

## 2015-06-02 NOTE — Telephone Encounter (Signed)
Called back and notified wife of advisement.

## 2015-06-02 NOTE — Telephone Encounter (Signed)
Please instruct him follow-up with his PCP or urgent care regarding facial swelling, as a nerve related problem would not cause these problems.    Donika K. Posey Pronto, DO

## 2015-06-02 NOTE — Telephone Encounter (Signed)
I spoke with patients wife. States that patient is still having some numbness on the side of his face. She states he now has a swollen area on the left side of his jaw about the size of 2 inch circle. She states it's not painful. She states this happened to him back in November/December and it went away, at the time patient was put on antibiotics & prednisone and sxs subsided. States this came back again yesterday.

## 2015-06-02 NOTE — Telephone Encounter (Signed)
Pt wife Kalman Shan called and needs to talk to someone about medication please call [phone]

## 2015-07-29 ENCOUNTER — Ambulatory Visit (HOSPITAL_COMMUNITY)
Admission: RE | Admit: 2015-07-29 | Discharge: 2015-07-29 | Disposition: A | Discharge status: Home / Self Care | Payer: BLUE CROSS/BLUE SHIELD | Source: Ambulatory Visit | Attending: Neurology | Admitting: Neurology

## 2015-07-29 ENCOUNTER — Encounter: Payer: Self-pay | Admitting: Neurology

## 2015-07-29 ENCOUNTER — Ambulatory Visit (INDEPENDENT_AMBULATORY_CARE_PROVIDER_SITE_OTHER): Payer: BLUE CROSS/BLUE SHIELD | Admitting: Neurology

## 2015-07-29 VITALS — BP 110/78 | HR 86 | Ht 68.0 in | Wt 208.2 lb

## 2015-07-29 DIAGNOSIS — G44219 Episodic tension-type headache, not intractable: Secondary | ICD-10-CM

## 2015-07-29 DIAGNOSIS — R202 Paresthesia of skin: Secondary | ICD-10-CM | POA: Insufficient documentation

## 2015-07-29 DIAGNOSIS — I6523 Occlusion and stenosis of bilateral carotid arteries: Secondary | ICD-10-CM | POA: Diagnosis not present

## 2015-07-29 DIAGNOSIS — H539 Unspecified visual disturbance: Secondary | ICD-10-CM | POA: Diagnosis not present

## 2015-07-29 DIAGNOSIS — R42 Dizziness and giddiness: Secondary | ICD-10-CM | POA: Diagnosis present

## 2015-07-29 DIAGNOSIS — H25013 Cortical age-related cataract, bilateral: Secondary | ICD-10-CM | POA: Diagnosis not present

## 2015-07-29 DIAGNOSIS — H524 Presbyopia: Secondary | ICD-10-CM | POA: Diagnosis not present

## 2015-07-29 DIAGNOSIS — H25042 Posterior subcapsular polar age-related cataract, left eye: Secondary | ICD-10-CM | POA: Diagnosis not present

## 2015-07-29 DIAGNOSIS — H2513 Age-related nuclear cataract, bilateral: Secondary | ICD-10-CM | POA: Diagnosis not present

## 2015-07-29 NOTE — Patient Instructions (Addendum)
1.  NCS/EMG of the left arm and leg 2.  US carotids 3.  Recommend seeing eye care specialist  Return to clinic in 4 months

## 2015-07-29 NOTE — Progress Notes (Signed)
Follow-up Visit   Date: 07/29/2015    [name] MRN: [id number] DOB: [birth year]   Interim History: Robert Andersen is a 55 y.o. right-handed Caucasian male with anxiety returning to the clinic for follow-up of generalized paresthesias.  The patient was accompanied to the clinic by wife who also provides collateral information.    History of present illness: Starting in early December 2016, he woke up complete bilateral arm and hand numbness which lasted a few hours but has been persistent since then, lasting 1-2 days and occuring several times per week. Denies any triggers, exacerbating factors. Two weeks later, he had fallen asleep on his sofa after taking ambien and when he woke up 3 hours later, realized the lights were still on so turned the lights off. He suffered a fall and was found by his wife awake, but groggy, and able to walk to the bedroom to sleep. He has been on [place] since 2009. There was no abnormal movements, tongue biting, or urinary/bowel incontinence.  On December 21st, he woke up with the left side of the face numbness which has been constant, however, over the past week, facial numbness is slowly improving, only affecting a small area of his lip on the left. He continues to have numbness over the left arm and leg which come and goes. He complains of weakness of his grip in both hands. He endorses dropping objects.   He complains blurry vision, no double vision. He has not had his eyes checked in several years.   He also has two month history of headaches, worse at the vertex, and described as pounding. No photophobia, phonophobia, nausea, and vomiting. Headaches usually lasts 6 hours and are relieved with aspirin, occuring about 3-4 times per week. He takes OTC medication for headaches 3-4 times per week.   His father passed away in 08/24/12 and takes xanax for anxiety related to this.  UPDATE 07/29/2015:  He no longer has paresthesias of the face,  but continues to have left sided paresthesias involving the arm and leg. He developed facial soon after his last visit and was treated with antibiotics.  Following this, his numbness slowly started to get better.  His headaches are improved and occur about once per week and relieved with tylenol.  He continues to complain of left blurry vision and photosensitivity.  He has not had his eyes checked.  MRI brain was normal, except there was note of paranasal sinusitis on the right.  Medications:  Current Outpatient Prescriptions on File Prior to Visit  Medication Sig Dispense Refill  . ALPRAZolam (XANAX) 1 MG tablet TAKE 1 TABLET BY MOUTH DAILY AS NEEDED FOR STRESS  5  . zolpidem (AMBIEN) 10 MG tablet Take 10 mg by mouth at bedtime.  5   No current facility-administered medications on file prior to visit.    Allergies: No Known Allergies  Review of Systems:  CONSTITUTIONAL: No fevers, chills, night sweats, or weight loss.  EYES: No visual changes or eye pain ENT: No hearing changes.  No history of nose bleeds.   RESPIRATORY: No cough, wheezing and shortness of breath.   CARDIOVASCULAR: Negative for chest pain, and palpitations.   GI: Negative for abdominal discomfort, blood in stools or black stools.  No recent change in bowel habits.   GU:  No history of incontinence.   MUSCLOSKELETAL: No history of joint pain or swelling.  No myalgias.   SKIN: Negative for lesions, rash, and itching.   ENDOCRINE:  Negative for cold or heat intolerance, polydipsia or goiter.   PSYCH:  + depression +anxiety symptoms.   NEURO: As Above.   Vital Signs:  BP 110/78 mmHg  Pulse 86  Ht 5\' 8"  (1.727 m)  Wt 208 lb 4 oz (94.462 kg)  BMI 31.67 kg/m2  SpO2 94%   Neurological Exam: MENTAL STATUS including orientation to time, place, person, recent and remote memory, attention span and concentration, language, and fund of knowledge is normal.  Speech is not dysarthric.  CRANIAL NERVES:  Fundoscopic exam is  limited. No visual field defects.  Pupils equal round and reactive to light.  Normal conjugate, extra-ocular eye movements in all directions of gaze.  No ptosis. Normal facial sensation.  Face is symmetric. Palate elevates symmetrically.  Tongue is midline.  MOTOR:  Motor strength is 5/5 in all extremities.  No atrophy, fasciculations or abnormal movements.  No pronator drift.  Tone is normal.    MSRs:  Reflexes are 2+/4 throughout, except 1+ in the lower extremities bilaterally.  SENSORY: Very localized region over the left pretibial area with reduced pin prick  COORDINATION/GAIT: Gait narrow based and stable.  Unsteady with tandem gait.  Data: MRI brain wwo contrast 05/30/2015:   1. Normal for age MRI appearance of the brain. No explanation for headache or left facial numbness identified. 2. Mild to moderate obstructive pattern right paranasal sinusitis.  Labs 04/07/2015: CMP, CBC normal, LDL 87, TSH 1.883, vitamin B12 normal    IMPRESSION/PLAN: 1.  Left arm and leg paresthesias, nonspecific and does not follow nerve distribution  - NCS/EMG will be ordered  2.  Left blurry vision  - Referral to ophthalmology for evaluation  3.  Left facial paresthesias and headaches, improved  - MRI brain reviewed and is normal  - Reassurance provided  4.  Secondary stroke prevention  - US carotids given his lateralized symptoms and family history of cardiovascular disease  - Continue aspirin 81mg  daily  Return to clinic in 64months  The duration of this appointment visit was 25 minutes of face-to-face time with the patient.  Greater than 50% of this time was spent in counseling, explanation of diagnosis, planning of further management, and coordination of care.   Thank you for allowing me to participate in patient's care.  If I can answer any additional questions, I would be pleased to do so.    Sincerely,    Donika K. Posey Pronto, DO

## 2015-08-03 ENCOUNTER — Telehealth: Payer: Self-pay | Admitting: Neurology

## 2015-08-09 ENCOUNTER — Ambulatory Visit (INDEPENDENT_AMBULATORY_CARE_PROVIDER_SITE_OTHER): Payer: BLUE CROSS/BLUE SHIELD | Admitting: Neurology

## 2015-08-09 DIAGNOSIS — G5602 Carpal tunnel syndrome, left upper limb: Secondary | ICD-10-CM

## 2015-08-09 DIAGNOSIS — H539 Unspecified visual disturbance: Secondary | ICD-10-CM

## 2015-08-09 DIAGNOSIS — R202 Paresthesia of skin: Secondary | ICD-10-CM

## 2015-08-09 DIAGNOSIS — M5417 Radiculopathy, lumbosacral region: Secondary | ICD-10-CM

## 2015-08-09 NOTE — Procedures (Addendum)
[facility]  [address]  [place], [place] [address] Tel: [phone] Fax:  [phone] Test Date:  08/09/2015  Patient: Robert Andersen DOB: [birth year] Physician: Narda Amber, DO  Sex: Male Height: 5\' 9"  Ref Phys: Narda Amber, DO  ID#: [id number] Temp: 34.6C Technician: Jerilynn Mages. Dean   Patient Complaints: This is a 55 year old gentleman referred for evaluation of left arm and leg numbness.  NCV & EMG Findings: Extensive electrodiagnostic testing of the left upper and lower extremity shows: 1. The left median sensory nerve showed prolonged distal peak latency (5.2 ms) and reduced amplitude (7.9 V). Left ulnar sensory response is within normal limits. 2. Left median motor nerve showed prolonged distal onset latency (6.3 ms) and normal amplitude. 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  Left ulnar motor responses within normal limits. 3. Left sural and superficial peroneal sensory responses are within normal limits. 4. Left peroneal motor response recording at the extensor digitorum brevis is reduced; however, when recording at the tibialis anterior motor responses within normal limits. Left tibial motor responses within normal limits. 5. In the upper extremity, there is no evidence of active or chronic motor axon loss changes affecting any of the tested muscles. 6. Chronic motor axon loss changes are seen affecting the rectus femoris and adductor longus muscles on the right side only.  Impression: 1. Left median neuropathy at or distal to the wrist, consistent with the clinical diagnosis of carpal tunnel syndrome. Overall, these findings are moderate to severe in degree electrically. 2. Chronic L3-L4 radiculopathy affecting the left side, mild to moderate in degree electrically. 3. 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   ___________________________ Narda Amber, DO    Nerve Conduction Studies Anti Sensory Summary Table   Site NR Peak (ms) Norm Peak (ms) P-T Amp (V) Norm P-T Amp  Left Median Anti Sensory (2nd Digit)  34.6C  Wrist    5.2 <3.6 7.9 >15  Left Sup Peroneal Anti Sensory (Ant Lat Mall)  34.6C  12 cm    3.0 <4.6 4.7 >4  Left Sural Anti Sensory (Lat Mall)  34.6C  Calf    3.3 <4.6 4.6 >4  Left Ulnar Anti Sensory (5th Digit)  34.6C  Wrist    3.0 <3.1 10.1 >10   Motor Summary Table   Site NR Onset (ms) Norm Onset (ms) O-P Amp (mV) Norm O-P Amp Site1 Site2 Delta-0 (ms) Dist (cm) Vel (m/s) Norm Vel (m/s)  Left Median Motor (Abd Poll Brev)  34.6C  Wrist    6.3 <4.0 8.3 >6 Elbow Wrist 3.9 24.0 62 >50  Elbow    10.2  7.1  Axilla Elbow 5.8 0.0    Axilla    4.4  3.5         Left Peroneal Motor (Ext Dig Brev)  34.6C  Ankle    4.6 <6.0 1.1 >2.5 B Fib Ankle 8.1 35.0 43 >40  B Fib    12.7  1.0  Poplt B Fib 2.0 10.0 50 >40  Poplt    14.7  0.9         Left Peroneal TA Motor (Tib Ant)  34.6C  Fib Head    2.5 <4.5 3.3 >3 Poplit Fib Head 1.6 10.0 63 >40  Poplit    4.1  3.1  Left Tibial Motor (Abd Hall Brev)  34.6C  Ankle    3.4 <6.0 6.2 >4 Knee Ankle 9.3 41.0 44 >40  Knee    12.7  3.3         Left Ulnar Motor (Abd Dig Minimi)  34.6C  Wrist    2.4 <3.1 8.8 >7 B Elbow Wrist 3.8 22.0 58 >50  B Elbow    6.2  7.2  A Elbow B Elbow 1.7 10.0 59 >50  A Elbow    7.9  6.9          EMG   Side Muscle Ins Act Fibs Psw Fasc Number Recrt Dur Dur. Amp Amp. Poly Poly. Comment  Left AntTibialis Nml Nml Nml Nml Nml Nml Nml Nml Nml Nml Nml Nml N/A  Left 1stDorInt Nml Nml Nml Nml Nml Nml Nml Nml Nml Nml Nml Nml N/A  Left Abd Poll Brev Nml Nml Nml Nml Nml Nml Nml Nml Nml Nml Nml Nml N/A  Left Ext Indicis Nml Nml Nml Nml Nml Nml Nml Nml Nml Nml Nml Nml N/A  Left PronatorTeres Nml Nml Nml Nml Nml Nml Nml Nml Nml Nml Nml Nml N/A  Left Biceps Nml Nml Nml Nml  Nml Nml Nml Nml Nml Nml Nml Nml N/A  Left Triceps Nml Nml Nml Nml Nml Nml Nml Nml Nml Nml Nml Nml N/A  Left Deltoid Nml Nml Nml Nml Nml Nml Nml Nml Nml Nml Nml Nml N/A  Left Gastroc Nml Nml Nml Nml Nml Nml Nml Nml Nml Nml Nml Nml N/A  Left Flex Dig Long Nml Nml Nml Nml Nml Nml Nml Nml Nml Nml Nml Nml N/A  Left RectFemoris Nml Nml Nml Nml 1- Rapid Some 1+ Some 1+ Some 1+ N/A  Left GluteusMed Nml Nml Nml Nml Nml Nml Nml Nml Nml Nml Nml Nml N/A  Left AdductorLong Nml Nml Nml Nml 1- Rapid Some 1+ Some 1+ Some 1+ N/A  Right RectFemoris Nml Nml Nml Nml Nml Nml Nml Nml Nml Nml Nml Nml N/A      Waveforms:

## 2015-08-17 DIAGNOSIS — H524 Presbyopia: Secondary | ICD-10-CM | POA: Diagnosis not present

## 2015-08-17 DIAGNOSIS — H2513 Age-related nuclear cataract, bilateral: Secondary | ICD-10-CM | POA: Diagnosis not present

## 2015-08-17 DIAGNOSIS — H25013 Cortical age-related cataract, bilateral: Secondary | ICD-10-CM | POA: Diagnosis not present

## 2015-08-17 DIAGNOSIS — H25042 Posterior subcapsular polar age-related cataract, left eye: Secondary | ICD-10-CM | POA: Diagnosis not present

## 2015-09-21 DIAGNOSIS — H2512 Age-related nuclear cataract, left eye: Secondary | ICD-10-CM | POA: Diagnosis not present

## 2015-09-21 DIAGNOSIS — H25812 Combined forms of age-related cataract, left eye: Secondary | ICD-10-CM | POA: Diagnosis not present

## 2015-10-10 DIAGNOSIS — H2511 Age-related nuclear cataract, right eye: Secondary | ICD-10-CM | POA: Diagnosis not present

## 2015-10-10 DIAGNOSIS — H25011 Cortical age-related cataract, right eye: Secondary | ICD-10-CM | POA: Diagnosis not present

## 2015-10-12 DIAGNOSIS — H25811 Combined forms of age-related cataract, right eye: Secondary | ICD-10-CM | POA: Diagnosis not present

## 2015-10-12 DIAGNOSIS — H2511 Age-related nuclear cataract, right eye: Secondary | ICD-10-CM | POA: Diagnosis not present

## 2015-11-08 DIAGNOSIS — G5602 Carpal tunnel syndrome, left upper limb: Secondary | ICD-10-CM

## 2015-11-08 DIAGNOSIS — M5416 Radiculopathy, lumbar region: Secondary | ICD-10-CM

## 2015-11-08 HISTORY — DX: Radiculopathy, lumbar region: M54.16

## 2015-11-08 HISTORY — DX: Carpal tunnel syndrome, left upper limb: G56.02

## 2015-11-15 DIAGNOSIS — F419 Anxiety disorder, unspecified: Secondary | ICD-10-CM | POA: Diagnosis not present

## 2015-11-15 DIAGNOSIS — G4700 Insomnia, unspecified: Secondary | ICD-10-CM | POA: Diagnosis not present

## 2015-11-15 DIAGNOSIS — M5416 Radiculopathy, lumbar region: Secondary | ICD-10-CM | POA: Diagnosis not present

## 2015-11-15 DIAGNOSIS — F329 Major depressive disorder, single episode, unspecified: Secondary | ICD-10-CM | POA: Diagnosis not present

## 2015-12-02 ENCOUNTER — Ambulatory Visit: Payer: BLUE CROSS/BLUE SHIELD | Admitting: Neurology

## 2015-12-05 ENCOUNTER — Encounter: Payer: Self-pay | Admitting: Neurology

## 2015-12-05 ENCOUNTER — Ambulatory Visit (INDEPENDENT_AMBULATORY_CARE_PROVIDER_SITE_OTHER): Payer: BLUE CROSS/BLUE SHIELD | Admitting: Neurology

## 2015-12-05 VITALS — BP 130/80 | HR 92 | Ht 68.0 in | Wt 219.0 lb

## 2015-12-05 DIAGNOSIS — G5602 Carpal tunnel syndrome, left upper limb: Secondary | ICD-10-CM

## 2015-12-05 DIAGNOSIS — M5417 Radiculopathy, lumbosacral region: Secondary | ICD-10-CM | POA: Insufficient documentation

## 2015-12-05 NOTE — Progress Notes (Signed)
Follow-up Visit   Date: 12/05/15    Robert Andersen MRN: [id number] DOB: [birth year]   Interim History: Robert Andersen is a 55 y.o. 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  The patient was accompanied to the clinic by self.   History of present illness: Starting in early December 2016, he woke up complete bilateral arm and hand numbness which lasted a few hours but has been persistent since then, lasting 1-2 days and occuring several times per week. Denies any triggers, exacerbating factors. Two weeks later, he had fallen asleep on his sofa after taking ambien and when he woke up 3 hours later, realized the lights were still on so turned the lights off. He suffered a fall and was found by his wife awake, but groggy, and able to walk to the bedroom to sleep. He has been on [place] since 2009. There was no abnormal movements, tongue biting, or urinary/bowel incontinence.  On December 21st, he woke up with the left side of the face numbness which has been constant, however, over the past week, facial numbness is slowly improving, only affecting a small area of his lip on the left. He continues to have numbness over the left arm and leg which come and goes. He complains of weakness of his grip in both hands. He endorses dropping objects.   He complains blurry vision, no double vision. He has not had his eyes checked in several years.   He also has two month history of headaches, worse at the vertex, and described as pounding. No photophobia, phonophobia, nausea, and vomiting. Headaches usually lasts 6 hours and are relieved with aspirin, occuring about 3-4 times per week. He takes OTC medication for headaches 3-4 times per week.   His father passed away in 08/09/12 and takes xanax for anxiety related to this.  UPDATE 07/29/2015:  He no longer has paresthesias of the face, but continues to have left sided  paresthesias involving the arm and leg. He developed facial soon after his last visit and was treated with antibiotics.  Following this, his numbness slowly started to get better.  His headaches are improved and occur about once per week and relieved with tylenol.  He continues to complain of left blurry vision and photosensitivity.  He has not had his eyes checked.  MRI brain was normal, except there was note of paranasal sinusitis on the right.  UPDATE 12/05/2015:  His NCS/EMG in 08/10/2022 showed moderate-severe left carpal tunnel syndrome and chronic L3-4 radiculopathy on the left.  He has been using a wrist splint at nighttime, which helps.  He has noticed some grip weakness, but does not wish to have it surgically evaluated.  His left leg paresthesias is also slightly improved.  He saw opthalmology and was found to have cataracts which was removed and now has significantly improved vision.   Medications:  Current Outpatient Prescriptions on File Prior to Visit  Medication Sig Dispense Refill  . ALPRAZolam (XANAX) 1 MG tablet TAKE 1 TABLET BY MOUTH DAILY AS NEEDED FOR STRESS  5  . aspirin 81 MG tablet Take 81 mg by mouth daily.    Marland Kitchen zolpidem (AMBIEN) 10 MG tablet Take 10 mg by mouth at bedtime.  5   No current facility-administered medications on file prior to visit.     Allergies: No Known Allergies  Review of Systems:  CONSTITUTIONAL: No fevers, chills, night sweats, or weight loss.  EYES: No visual changes or eye pain ENT: No hearing changes.  No history of nose bleeds.   RESPIRATORY: No cough, wheezing and shortness of breath.   CARDIOVASCULAR: Negative for chest pain, and palpitations.   GI: Negative for abdominal discomfort, blood in stools or black stools.  No recent change in bowel habits.   GU:  No history of incontinence.   MUSCLOSKELETAL: No history of joint pain or swelling.  No myalgias.   SKIN: Negative for lesions, rash, and itching.   ENDOCRINE: Negative for cold or heat  intolerance, polydipsia or goiter.   PSYCH:  + depression +anxiety symptoms.   NEURO: As Above.   Vital Signs:  BP 130/80   Pulse 92   Ht 5\' 8"  (1.727 m)   Wt 219 lb (99.3 kg)   SpO2 98%   BMI 33.30 kg/m    Neurological Exam: MENTAL STATUS including orientation to time, place, person, recent and remote memory, attention span and concentration, language, and fund of knowledge is normal.  Speech is not dysarthric.  CRANIAL NERVES:   No visual field defects.  Pupils equal round and reactive to light.  Normal conjugate, extra-ocular eye movements in all directions of gaze.  No ptosis. Normal facial sensation.  Face is symmetric. Palate elevates symmetrically.  Tongue is midline.  MOTOR:  Motor strength is 5/5 in all extremities, including left ABP.  No pronator drift.  Tone is normal.    MSRs:  Reflexes are 2+/4 throughout, except 1+ in the lower extremities bilaterally.  SENSORY: Localized region over the left pretibial area with reduced pin prick and temperature  COORDINATION/GAIT: Gait narrow based and stable.   Data: MRI brain wwo contrast 05/30/2015:   1. Normal for age MRI appearance of the brain. No explanation for headache or left facial numbness identified. 2. Mild to moderate obstructive pattern right paranasal sinusitis.  Labs 04/07/2015: CMP, CBC normal, LDL 87, TSH 1.883, vitamin B12 normal   NCS/EMG of the left arm and leg 08/09/2015: 1. Left median neuropathy at or distal to the wrist, consistent with the clinical diagnosis of carpal tunnel syndrome. Overall, these findings are moderate to severe in degree electrically. 2. Chronic L3-L4 radiculopathy affecting the left side, mild to moderate in degree electrically. 3. Incidentally, there is a Martin-Gruber anastomosis on the left, a normal variant.   IMPRESSION/PLAN: 1.  Left carpal tunnel syndrome, moderate to severe on NCS - stable  - Continue to use a wrist splint  - Referral to Orthopaedics declined  2.   Chronic left L3-4 radiculopathy  - PT declined  - Consider MRI lumbar spine, if symptoms get worse  3.  Left facial paresthesias and headaches, resolved  - MRI brain reviewed and is normal  4.  Secondary stroke prevention  - US carotids given his lateralized symptoms and family history of cardiovascular disease  - Continue aspirin 81mg  daily  Return to clinic as needed  The duration of this appointment visit was 25 minutes of face-to-face time with the patient.  Greater than 50% of this time was spent in counseling, explanation of diagnosis, planning of further management, and coordination of care.   Thank you for allowing me to participate in patient's care.  If I can answer any additional questions, I would be pleased to do so.    Sincerely,    Tejal Monroy K. Posey Pronto, DO

## 2015-12-05 NOTE — Patient Instructions (Signed)
Continue to use wrist splint.  If symptoms get worse, call my office

## 2016-04-20 NOTE — Telephone Encounter (Signed)
Complete

## 2016-05-14 DIAGNOSIS — H35462 Secondary vitreoretinal degeneration, left eye: Secondary | ICD-10-CM | POA: Diagnosis not present

## 2016-05-14 DIAGNOSIS — H26493 Other secondary cataract, bilateral: Secondary | ICD-10-CM | POA: Diagnosis not present

## 2016-05-14 DIAGNOSIS — Z961 Presence of intraocular lens: Secondary | ICD-10-CM | POA: Diagnosis not present

## 2016-05-23 ENCOUNTER — Encounter (INDEPENDENT_AMBULATORY_CARE_PROVIDER_SITE_OTHER): Payer: BLUE CROSS/BLUE SHIELD | Admitting: Ophthalmology

## 2016-05-23 DIAGNOSIS — H33302 Unspecified retinal break, left eye: Secondary | ICD-10-CM

## 2016-05-23 DIAGNOSIS — H33021 Retinal detachment with multiple breaks, right eye: Secondary | ICD-10-CM | POA: Diagnosis not present

## 2016-05-23 DIAGNOSIS — H26493 Other secondary cataract, bilateral: Secondary | ICD-10-CM | POA: Diagnosis not present

## 2016-05-23 DIAGNOSIS — H43813 Vitreous degeneration, bilateral: Secondary | ICD-10-CM

## 2016-05-28 DIAGNOSIS — G5602 Carpal tunnel syndrome, left upper limb: Secondary | ICD-10-CM | POA: Diagnosis not present

## 2016-05-28 DIAGNOSIS — G4700 Insomnia, unspecified: Secondary | ICD-10-CM | POA: Diagnosis not present

## 2016-05-28 DIAGNOSIS — F419 Anxiety disorder, unspecified: Secondary | ICD-10-CM | POA: Diagnosis not present

## 2016-05-28 DIAGNOSIS — F329 Major depressive disorder, single episode, unspecified: Secondary | ICD-10-CM | POA: Diagnosis not present

## 2016-05-30 ENCOUNTER — Encounter (INDEPENDENT_AMBULATORY_CARE_PROVIDER_SITE_OTHER): Payer: BLUE CROSS/BLUE SHIELD | Admitting: Ophthalmology

## 2016-05-30 DIAGNOSIS — H33021 Retinal detachment with multiple breaks, right eye: Secondary | ICD-10-CM

## 2016-06-06 ENCOUNTER — Ambulatory Visit (INDEPENDENT_AMBULATORY_CARE_PROVIDER_SITE_OTHER): Payer: BLUE CROSS/BLUE SHIELD | Admitting: Ophthalmology

## 2016-06-06 DIAGNOSIS — H33303 Unspecified retinal break, bilateral: Secondary | ICD-10-CM

## 2016-10-05 ENCOUNTER — Ambulatory Visit (INDEPENDENT_AMBULATORY_CARE_PROVIDER_SITE_OTHER): Payer: BLUE CROSS/BLUE SHIELD | Admitting: Ophthalmology

## 2016-10-05 DIAGNOSIS — H43813 Vitreous degeneration, bilateral: Secondary | ICD-10-CM

## 2016-10-05 DIAGNOSIS — H33303 Unspecified retinal break, bilateral: Secondary | ICD-10-CM

## 2016-10-22 DIAGNOSIS — H26491 Other secondary cataract, right eye: Secondary | ICD-10-CM | POA: Diagnosis not present

## 2016-10-22 DIAGNOSIS — H31003 Unspecified chorioretinal scars, bilateral: Secondary | ICD-10-CM | POA: Diagnosis not present

## 2016-10-22 DIAGNOSIS — Z961 Presence of intraocular lens: Secondary | ICD-10-CM | POA: Diagnosis not present

## 2016-10-22 DIAGNOSIS — H26492 Other secondary cataract, left eye: Secondary | ICD-10-CM | POA: Diagnosis not present

## 2016-11-06 DIAGNOSIS — H26491 Other secondary cataract, right eye: Secondary | ICD-10-CM | POA: Diagnosis not present

## 2016-12-06 DIAGNOSIS — F329 Major depressive disorder, single episode, unspecified: Secondary | ICD-10-CM | POA: Diagnosis not present

## 2016-12-06 DIAGNOSIS — J0191 Acute recurrent sinusitis, unspecified: Secondary | ICD-10-CM | POA: Diagnosis not present

## 2016-12-06 DIAGNOSIS — G4700 Insomnia, unspecified: Secondary | ICD-10-CM | POA: Diagnosis not present

## 2016-12-06 DIAGNOSIS — F419 Anxiety disorder, unspecified: Secondary | ICD-10-CM | POA: Diagnosis not present

## 2017-02-07 DIAGNOSIS — H3509 Other intraretinal microvascular abnormalities: Secondary | ICD-10-CM | POA: Diagnosis not present

## 2017-02-07 DIAGNOSIS — Z961 Presence of intraocular lens: Secondary | ICD-10-CM | POA: Diagnosis not present

## 2017-02-07 DIAGNOSIS — H43812 Vitreous degeneration, left eye: Secondary | ICD-10-CM | POA: Diagnosis not present

## 2017-02-07 DIAGNOSIS — H31003 Unspecified chorioretinal scars, bilateral: Secondary | ICD-10-CM | POA: Diagnosis not present

## 2017-03-09 DIAGNOSIS — J0100 Acute maxillary sinusitis, unspecified: Secondary | ICD-10-CM | POA: Diagnosis not present

## 2017-04-03 DIAGNOSIS — J019 Acute sinusitis, unspecified: Secondary | ICD-10-CM | POA: Diagnosis not present

## 2017-05-20 IMAGING — MR MR HEAD WO/W CM
10 of 12 series · 36 of 48 positions shown · IV contrast (20ml Multihance)
Comparison: Orbit radiographs from today reported separately. Head
CT without contrast 01/26/2008.

CLINICAL DATA: 55-year-old male with new onset headaches. Pain at
the top of the head with numbness on the left side of the face since
[REDACTED]. No known injury. Initial encounter.

EXAM:
MRI HEAD WITHOUT AND WITH CONTRAST
TECHNIQUE: Multiplanar, multiecho pulse sequences of the brain and surrounding
structures were obtained without and with intravenous contrast.
CONTRAST:  20mL MULTIHANCE GADOBENATE DIMEGLUMINE 529 MG/ML IV SOLN

[Series 2: T1 · sagittal · 5.0mm · 0.45mm/px · 2 of 19 slices shown]
[im 1/19]
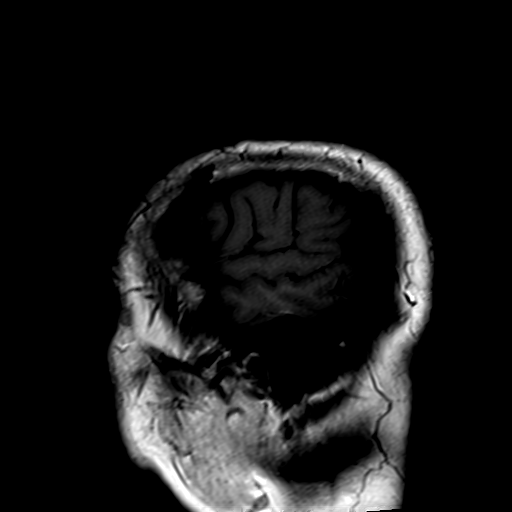
[im 19/19]
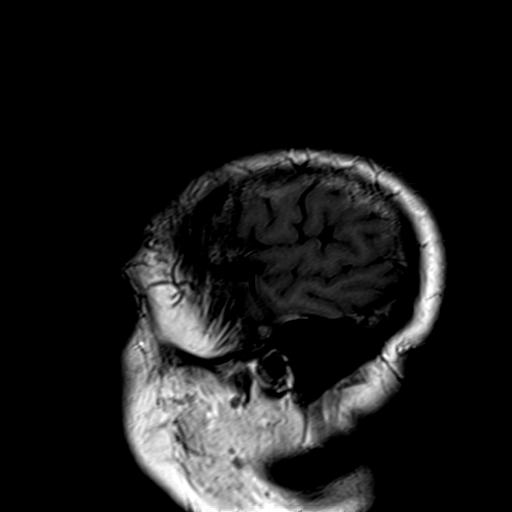

[Series 3: T2 · axial · 5.0mm · 0.30mm/px · z∈[-68,+68]mm · 2 of 22 slices shown (1 of 2)]
[im 1/22]
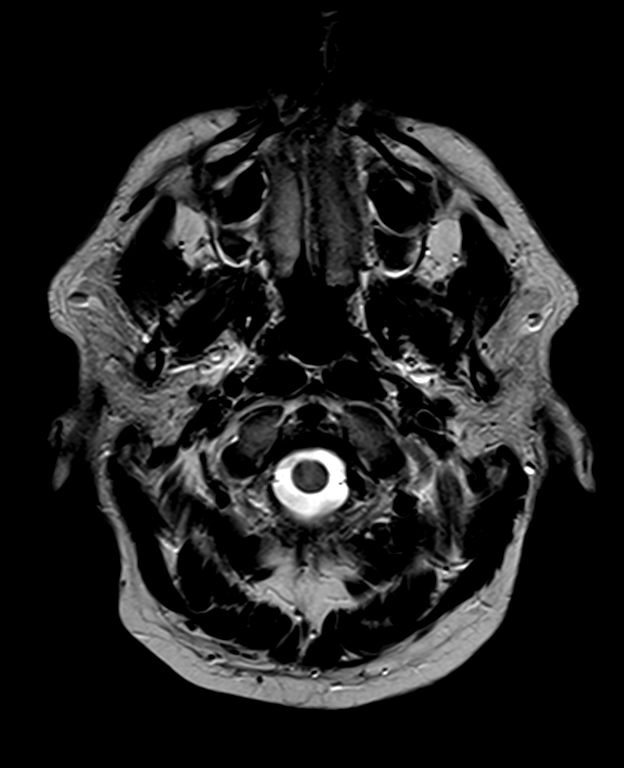
[im 22/22]
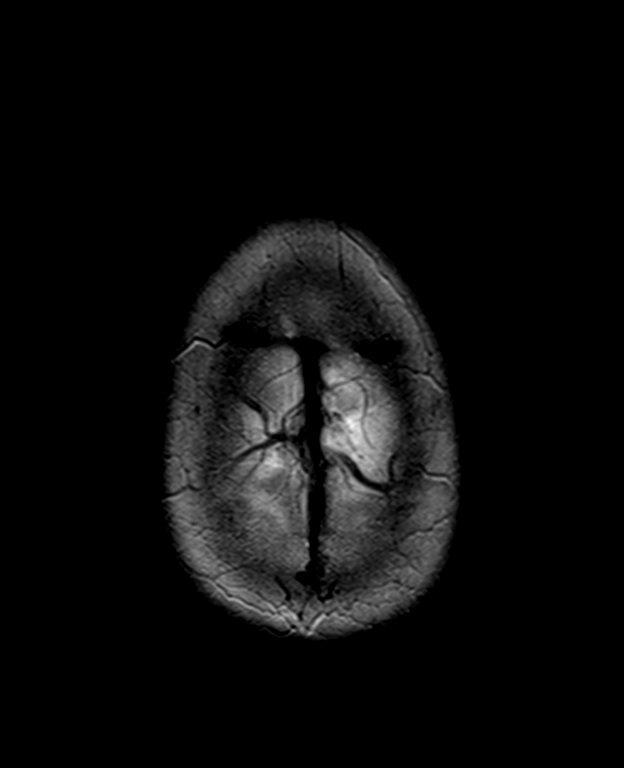

[Series 4: DWI · axial · 3.0mm · 1.80mm/px · z∈[-69,+66]mm · 7 of 82 slices shown (1 of 4)]
[im 1/82]
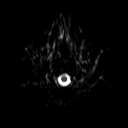
[im 14/82]
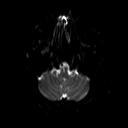
[im 28/82]
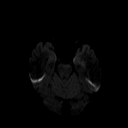
[im 41/82]
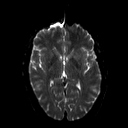
[im 55/82]
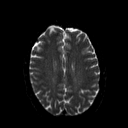
[im 68/82]
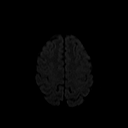
[im 82/82]
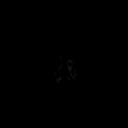

[Series 5: DWI · axial · 3.0mm · 1.80mm/px · z∈[-69,+66]mm · 4 of 42 slices shown (2 of 4)]
[im 1/42]
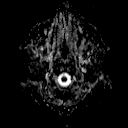
[im 14/42]
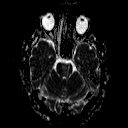
[im 28/42]
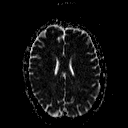
[im 42/42]
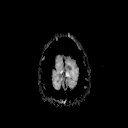

[Series 6: DWI · coronal · 5.0mm · 1.80mm/px · 6 of 68 slices shown (3 of 4)]
[im 1/68]
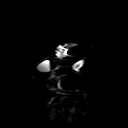
[im 14/68]
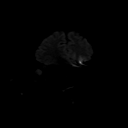
[im 27/68]
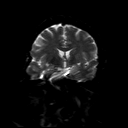
[im 41/68]
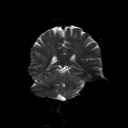
[im 54/68]
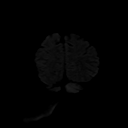
[im 68/68]
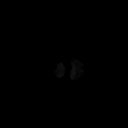

[Series 7: DWI · coronal · 5.0mm · 1.80mm/px · 3 of 34 slices shown (4 of 4)]
[im 1/34]
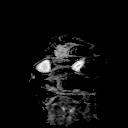
[im 17/34]
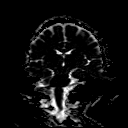
[im 34/34]
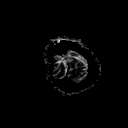

[Series 8: FLAIR · axial · 5.0mm · 0.45mm/px · z∈[-68,+68]mm · 2 of 22 slices shown]
[im 1/22]
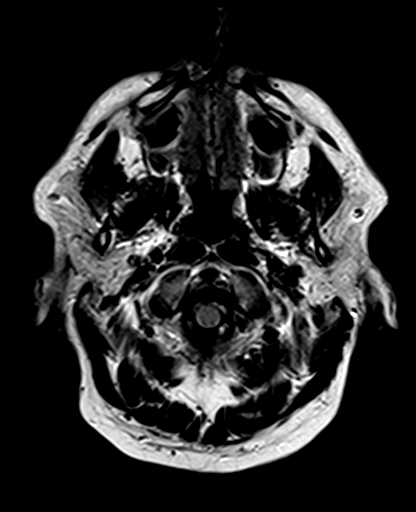
[im 22/22]
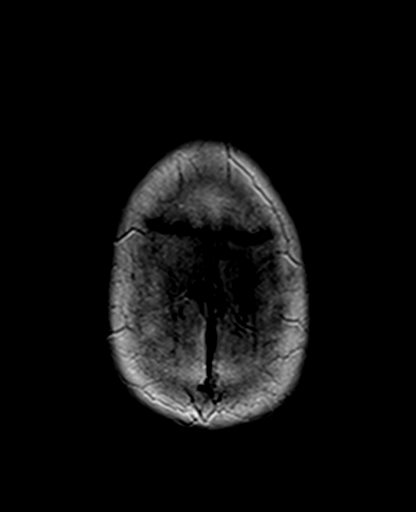

[Series 11: swi_images · axial · 2.0mm · 0.90mm/px · z∈[-71,+70]mm · 6 of 72 slices shown]
[im 1/72]
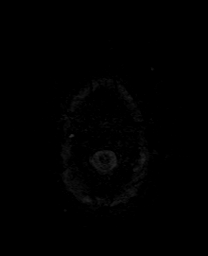
[im 15/72]
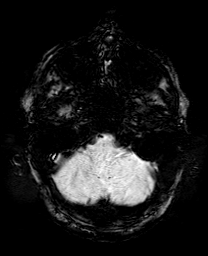
[im 29/72]
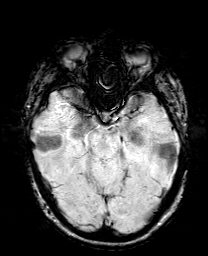
[im 43/72]
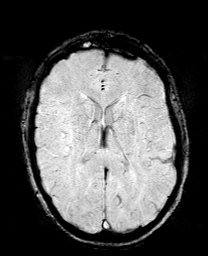
[im 57/72]
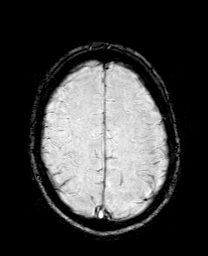
[im 72/72]
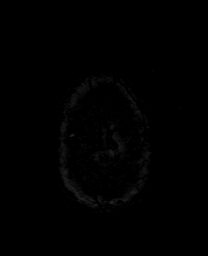

[Series 12: T2 · coronal · 5.0mm · 0.45mm/px · 2 of 24 slices shown (2 of 2)]
[im 1/24]
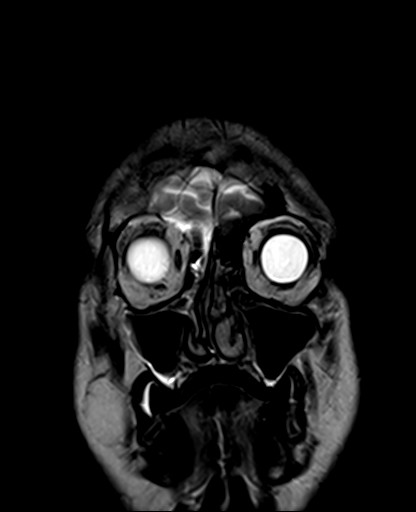
[im 24/24]
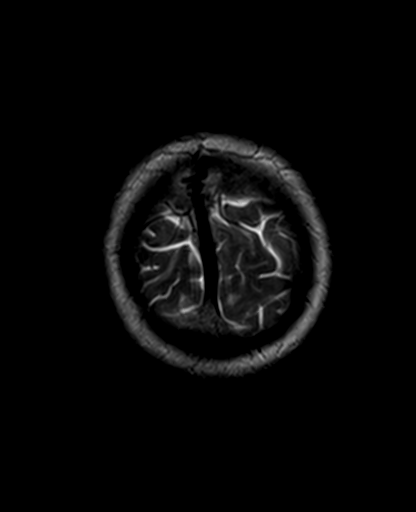

[Series 14: T1 post-contrast · coronal · 5.0mm · 0.43mm/px · 2 of 24 slices shown]
[im 1/24]
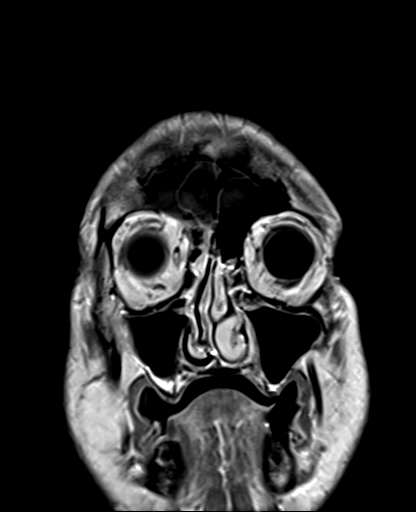
[im 24/24]
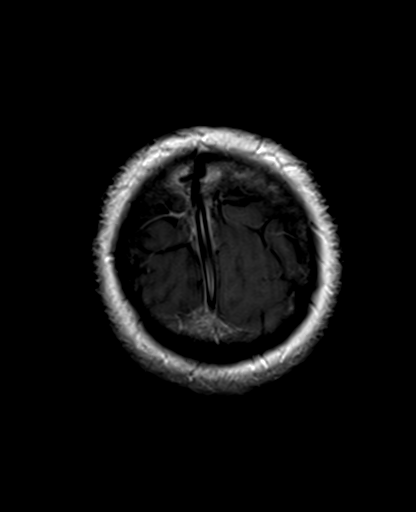

[36 of 48 positions shown; findings below may reference images not displayed]

FINDINGS: Cerebral volume is within normal limits. No restricted diffusion to
suggest acute infarction. No midline shift, mass effect, evidence of
mass lesion, ventriculomegaly, extra-axial collection or acute
intracranial hemorrhage. Cervicomedullary junction and pituitary are
within normal limits. Negative visualized cervical spine. Major
intracranial vascular flow voids are preserved, with dominant
appearing distal left vertebral artery.

Gray and white matter signal is within normal limits for age
throughout the brain. No cortical encephalomalacia or chronic
cerebral blood products identified. No abnormal enhancement
identified. No dural thickening. Cavernous sinus appears normal.
Left V3 segment and infraorbital nerve appear normal. Normal left
Meckel's cave and left fifth nerve cisternal segment.

Visible internal auditory structures appear normal. Mastoids are
clear. Subtotal opacification of the right frontal sinus due to
mucosal thickening. Mild to moderate right ethmoid and mild
maxillary sinus mucosal thickening. Negative orbit and scalp soft
tissues. Normal bone marrow signal.
IMPRESSION: 1. Normal for age MRI appearance of the brain. No explanation for
headache or left facial numbness identified.
2. Mild to moderate obstructive pattern right paranasal sinusitis.

## 2017-06-06 DIAGNOSIS — F419 Anxiety disorder, unspecified: Secondary | ICD-10-CM | POA: Diagnosis not present

## 2017-06-06 DIAGNOSIS — F329 Major depressive disorder, single episode, unspecified: Secondary | ICD-10-CM | POA: Diagnosis not present

## 2017-06-06 DIAGNOSIS — G4700 Insomnia, unspecified: Secondary | ICD-10-CM | POA: Diagnosis not present

## 2017-06-06 DIAGNOSIS — G5602 Carpal tunnel syndrome, left upper limb: Secondary | ICD-10-CM | POA: Diagnosis not present

## 2017-09-03 DIAGNOSIS — R05 Cough: Secondary | ICD-10-CM | POA: Diagnosis not present

## 2017-09-03 DIAGNOSIS — J0100 Acute maxillary sinusitis, unspecified: Secondary | ICD-10-CM | POA: Diagnosis not present

## 2017-09-03 DIAGNOSIS — H6692 Otitis media, unspecified, left ear: Secondary | ICD-10-CM | POA: Diagnosis not present

## 2017-09-03 DIAGNOSIS — Z6833 Body mass index (BMI) 33.0-33.9, adult: Secondary | ICD-10-CM | POA: Diagnosis not present

## 2017-12-06 DIAGNOSIS — E782 Mixed hyperlipidemia: Secondary | ICD-10-CM | POA: Diagnosis not present

## 2017-12-06 DIAGNOSIS — G5602 Carpal tunnel syndrome, left upper limb: Secondary | ICD-10-CM | POA: Diagnosis not present

## 2017-12-06 DIAGNOSIS — F419 Anxiety disorder, unspecified: Secondary | ICD-10-CM | POA: Diagnosis not present

## 2017-12-06 DIAGNOSIS — F329 Major depressive disorder, single episode, unspecified: Secondary | ICD-10-CM | POA: Diagnosis not present

## 2017-12-06 DIAGNOSIS — G4700 Insomnia, unspecified: Secondary | ICD-10-CM | POA: Diagnosis not present

## 2018-01-29 ENCOUNTER — Ambulatory Visit: Payer: Self-pay | Admitting: Orthopedic Surgery

## 2018-02-04 ENCOUNTER — Ambulatory Visit: Payer: Self-pay | Admitting: Orthopedic Surgery

## 2018-02-04 NOTE — H&P (View-Only) (Signed)
Robert Andersen is an 57 y.o. male.   Chief Complaint: L knee pain HPI: Reason for Visit: left knee Context: work injury (10/22/2017); The patient is 13 weeks out from the injury Location (Lower Extremity): knee pain on the left Severity: pain level 5/10 Are you working? The patient currently has light duty restrictions of no lifting over 10 lbs, no pushing or pulling, no bending, stooping, squatting,climbing, no prolonged sitting or standing and okay to use brace. The patient is to work 8 hrs/day, 5 days per week. Medications: The patient is currently taking Aleve and gabapentin. Notes: The patient is currently doing physical therapy. [name] follows up today for recheck of his left knee. He is now 13 weeks out from injury where he was Caught between a pain of glass in the edge of a forklift with this knee. His MRI did show an MCL sprain as well as a meniscus tear. He has been doing physical therapy and many times after physical therapy that day and the next day he feels as though his symptoms are worse. They have also been doing iontophoresis along with working on range of motion and strengthening. He has been wearing his brace for stability, states without it the knee feels much more unstable. Even despite doing physical therapy strengthening exercises and wearing the brace he has had 2 or 3 separate episodes since his last visit where the knee has given way and he has been very afraid that he will fall. The cortisone injection at the last visit helped for 1-2 days and then since his pain returned he seems to have gradually had increased symptoms since then. He states the knee seems to say somewhat swollen despite taking Aleve. He is also taking gabapentin for pain but does not feel as though that is helping. He really is not experiencing much in the way of numbness like he was early on, only occasionally at this point. He is still working light duty.  Past Medical History:  Diagnosis Date  . Anxiety    . Kidney stone     No past surgical history on file.  Family History  Problem Relation Age of Onset  . Colon cancer Mother   . Heart attack Father   . Diabetes Father   . Healthy Son    Social History:  reports that he has never smoked. He has never used smokeless tobacco. He reports that he does not drink alcohol or use drugs.  Allergies: No Known Allergies   (Not in a hospital admission)  No results found for this or any previous visit (from the past 48 hour(s)). No results found.  Review of Systems  Constitutional: Negative.   HENT: Negative.   Eyes: Negative.   Respiratory: Negative.   Cardiovascular: Negative.   Gastrointestinal: Negative.   Genitourinary: Negative.   Musculoskeletal: Positive for joint pain.  Skin: Negative.   Neurological: Positive for sensory change and weakness.    There were no vitals taken for this visit. Physical Exam  Constitutional: He is oriented to person, place, and time. He appears well-developed and well-nourished.  HENT:  Head: Normocephalic.  Eyes: Pupils are equal, round, and reactive to light.  Neck: Normal range of motion.  Cardiovascular: Normal rate.  Respiratory: Effort normal.  GI: Soft.  Musculoskeletal:  Patient is a 57 year old male.  Patient is awake, alert, oriented 3. Well-nourished and well-developed. Antalgic gait, no assistive devices.  On examination of the left knee, tender on palpation of the medial and lateral joint  [place], patella, and quadricep tendon. He is exquisitely tender lateral joint space with a slightly elevated pain response. Nontender patellar tendon, quadriceps tendon, patella, peroneal nerve and popliteal space. No calf pain or sign of DVT. No pain or laxity with varus or valgus stress. No instability noted. Painful medial and lateral McMurray's. Trace effusion noted. Range of motion 0-110. Mild patellofemoral crepitus. Positive patellofemoral pain on compression. Sensation intact distally.  No atrophy, no trophic changes or hair loss, no changes in the color of the skin around the knee or lower leg.  Neurological: He is alert and oriented to person, place, and time.  Skin: Skin is warm and dry.    Prior x-rays and MRI reviewed. X-rays with mild medial joint space narrowing. He does have a slightly laterally tilted patella.  MRI images and report reviewed today by Dr. Tonita Cong, mid body medial meniscus tear, chondromalacia patella.  Assessment/Plan Impression: Ongoing left knee pain 13 weeks out from a crush injury where the knee was caught between the edge of a forklift and a pain of glass, healed MCL sprain, ongoing knee pain, swelling and instability due to meniscus tear. He does also likely have some residual soft tissue symptoms, less predominantly at this point any ongoing nerve symptoms although he still has occasional numbness. No evidence of RSD.  Plan: We discussed relevant anatomy and etiology of his symptoms. At this point the MCL sprain should be healed. He has ongoing swelling, instability and pain in the knee consistent with meniscal pathology noted on his MRI. He only had a day or 2 of relief with the cortisone injection and actually since that relief wore off has had progressively worsening symptoms and more episodes of instability. Would recommend he continues to wear his brace for support in the interim. Since physical therapy does not seem to be helping him currently, he can continue with home exercise program on his own, specifically quad strengthening. Given his ongoing symptoms as discussed at the last visit would recommend proceeding with left knee arthroscopy, partial medial meniscectomy, debridement, possible lateral release. We discussed the surgery itself as well as risks, complications and alternatives including but not limited to DVT, PE, failure of procedure, need for secondary procedure, anesthesia risks and even death. We discussed postop protocols, use of  crutches for several days as needed, activity restrictions, ice and elevation, aspirin for DVT prophylaxis, time out of work. We discussed that he may have ongoing symptoms due to injury to the nerve from this incident, residual soft tissue symptoms associated with scar tissue formation from the injury itself. 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 We discussed options and patient would like to proceed with surgery is recommended at this time. He was seen in conjunction with Dr. Tonita Cong today and all his questions were answered. He will follow-up 10-14 days postoperatively for suture removal call with questions or concerns. No history of DVT. No history of MRSA. No allergies, okay to use Kefzol perioperatively. We will await approval through [employer] and then schedule accordingly.  Plan left knee scope, partial medial meniscectomy, debridement, possible lateral release  BISSELL, Conley Rolls., PA-C for Dr. Tonita Cong 02/04/2018, 8:31 AM

## 2018-02-04 NOTE — H&P (Signed)
Robert Andersen is an 57 y.o. male.   Chief Complaint: L knee pain HPI: Reason for Visit: left knee Context: work injury (10/22/2017); The patient is 13 weeks out from the injury Location (Lower Extremity): knee pain on the left Severity: pain level 5/10 Are you working? The patient currently has light duty restrictions of no lifting over 10 lbs, no pushing or pulling, no bending, stooping, squatting,climbing, no prolonged sitting or standing and okay to use brace. The patient is to work 8 hrs/day, 5 days per week. Medications: The patient is currently taking Aleve and gabapentin. Notes: The patient is currently doing physical therapy. [name] follows up today for recheck of his left knee. He is now 13 weeks out from injury where he was Caught between a pain of glass in the edge of a forklift with this knee. His MRI did show an MCL sprain as well as a meniscus tear. He has been doing physical therapy and many times after physical therapy that day and the next day he feels as though his symptoms are worse. They have also been doing iontophoresis along with working on range of motion and strengthening. He has been wearing his brace for stability, states without it the knee feels much more unstable. Even despite doing physical therapy strengthening exercises and wearing the brace he has had 2 or 3 separate episodes since his last visit where the knee has given way and he has been very afraid that he will fall. The cortisone injection at the last visit helped for 1-2 days and then since his pain returned he seems to have gradually had increased symptoms since then. He states the knee seems to say somewhat swollen despite taking Aleve. He is also taking gabapentin for pain but does not feel as though that is helping. He really is not experiencing much in the way of numbness like he was early on, only occasionally at this point. He is still working light duty.  Past Medical History:  Diagnosis Date  . Anxiety    . Kidney stone     No past surgical history on file.  Family History  Problem Relation Age of Onset  . Colon cancer Mother   . Heart attack Father   . Diabetes Father   . Healthy Son    Social History:  reports that he has never smoked. He has never used smokeless tobacco. He reports that he does not drink alcohol or use drugs.  Allergies: No Known Allergies   (Not in a hospital admission)  No results found for this or any previous visit (from the past 48 hour(s)). No results found.  Review of Systems  Constitutional: Negative.   HENT: Negative.   Eyes: Negative.   Respiratory: Negative.   Cardiovascular: Negative.   Gastrointestinal: Negative.   Genitourinary: Negative.   Musculoskeletal: Positive for joint pain.  Skin: Negative.   Neurological: Positive for sensory change and weakness.    There were no vitals taken for this visit. Physical Exam  Constitutional: He is oriented to person, place, and time. He appears well-developed and well-nourished.  HENT:  Head: Normocephalic.  Eyes: Pupils are equal, round, and reactive to light.  Neck: Normal range of motion.  Cardiovascular: Normal rate.  Respiratory: Effort normal.  GI: Soft.  Musculoskeletal:  Patient is a 57 year old male.  Patient is awake, alert, oriented 3. Well-nourished and well-developed. Antalgic gait, no assistive devices.  On examination of the left knee, tender on palpation of the medial and lateral joint  Flath, patella, and quadricep tendon. He is exquisitely tender lateral joint space with a slightly elevated pain response. Nontender patellar tendon, quadriceps tendon, patella, peroneal nerve and popliteal space. No calf pain or sign of DVT. No pain or laxity with varus or valgus stress. No instability noted. Painful medial and lateral McMurray's. Trace effusion noted. Range of motion 0-110. Mild patellofemoral crepitus. Positive patellofemoral pain on compression. Sensation intact distally.  No atrophy, no trophic changes or hair loss, no changes in the color of the skin around the knee or lower leg.  Neurological: He is alert and oriented to person, place, and time.  Skin: Skin is warm and dry.    Prior x-rays and MRI reviewed. X-rays with mild medial joint space narrowing. He does have a slightly laterally tilted patella.  MRI images and report reviewed today by Dr. Tonita Cong, mid body medial meniscus tear, chondromalacia patella.  Assessment/Plan Impression: Ongoing left knee pain 13 weeks out from a crush injury where the knee was caught between the edge of a forklift and a pain of glass, healed MCL sprain, ongoing knee pain, swelling and instability due to meniscus tear. He does also likely have some residual soft tissue symptoms, less predominantly at this point any ongoing nerve symptoms although he still has occasional numbness. No evidence of RSD.  Plan: We discussed relevant anatomy and etiology of his symptoms. At this point the MCL sprain should be healed. He has ongoing swelling, instability and pain in the knee consistent with meniscal pathology noted on his MRI. He only had a day or 2 of relief with the cortisone injection and actually since that relief wore off has had progressively worsening symptoms and more episodes of instability. Would recommend he continues to wear his brace for support in the interim. Since physical therapy does not seem to be helping him currently, he can continue with home exercise program on his own, specifically quad strengthening. Given his ongoing symptoms as discussed at the last visit would recommend proceeding with left knee arthroscopy, partial medial meniscectomy, debridement, possible lateral release. We discussed the surgery itself as well as risks, complications and alternatives including but not limited to DVT, PE, failure of procedure, need for secondary procedure, anesthesia risks and even death. We discussed postop protocols, use of  crutches for several days as needed, activity restrictions, ice and elevation, aspirin for DVT prophylaxis, time out of work. We discussed that he may have ongoing symptoms due to injury to the nerve from this incident, residual soft tissue symptoms associated with scar tissue formation from the injury itself. 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 We discussed options and patient would like to proceed with surgery is recommended at this time. He was seen in conjunction with Dr. Tonita Cong today and all his questions were answered. He will follow-up 10-14 days postoperatively for suture removal call with questions or concerns. No history of DVT. No history of MRSA. No allergies, okay to use Kefzol perioperatively. We will await approval through [employer] and then schedule accordingly.  Plan left knee scope, partial medial meniscectomy, debridement, possible lateral release  BISSELL, Conley Rolls., PA-C for Dr. Tonita Cong 02/04/2018, 8:31 AM

## 2018-02-14 ENCOUNTER — Encounter (HOSPITAL_BASED_OUTPATIENT_CLINIC_OR_DEPARTMENT_OTHER): Payer: Self-pay | Admitting: *Deleted

## 2018-02-17 ENCOUNTER — Other Ambulatory Visit: Payer: Self-pay

## 2018-02-17 ENCOUNTER — Encounter (HOSPITAL_BASED_OUTPATIENT_CLINIC_OR_DEPARTMENT_OTHER): Payer: Self-pay

## 2018-02-17 DIAGNOSIS — H3509 Other intraretinal microvascular abnormalities: Secondary | ICD-10-CM | POA: Diagnosis not present

## 2018-02-17 DIAGNOSIS — H43812 Vitreous degeneration, left eye: Secondary | ICD-10-CM | POA: Diagnosis not present

## 2018-02-17 DIAGNOSIS — H31003 Unspecified chorioretinal scars, bilateral: Secondary | ICD-10-CM | POA: Diagnosis not present

## 2018-02-17 DIAGNOSIS — Z961 Presence of intraocular lens: Secondary | ICD-10-CM | POA: Diagnosis not present

## 2018-02-17 NOTE — Progress Notes (Signed)
Spoke with: Deniz NPO:  No food after midnight/Clear liquids until 7:00AM DOS Arrival time: 1100AM Labs: N/A AM medications: Xanax if needed Pre op orders: Yes Ride home: Kalman Shan (wife) [phone]

## 2018-02-21 ENCOUNTER — Ambulatory Visit (HOSPITAL_BASED_OUTPATIENT_CLINIC_OR_DEPARTMENT_OTHER)
Admission: RE | Admit: 2018-02-21 | Discharge: 2018-02-21 | Disposition: A | Discharge status: Home / Self Care | Payer: No Typology Code available for payment source | Source: Ambulatory Visit | Attending: Specialist | Admitting: Specialist

## 2018-02-21 ENCOUNTER — Encounter (HOSPITAL_BASED_OUTPATIENT_CLINIC_OR_DEPARTMENT_OTHER)
Admission: RE | Disposition: A | Discharge status: Home / Self Care | Payer: Self-pay | Source: Ambulatory Visit | Attending: Specialist

## 2018-02-21 ENCOUNTER — Ambulatory Visit (HOSPITAL_BASED_OUTPATIENT_CLINIC_OR_DEPARTMENT_OTHER): Payer: No Typology Code available for payment source | Admitting: Anesthesiology

## 2018-02-21 ENCOUNTER — Encounter (HOSPITAL_BASED_OUTPATIENT_CLINIC_OR_DEPARTMENT_OTHER): Payer: Self-pay | Admitting: *Deleted

## 2018-02-21 DIAGNOSIS — M2242 Chondromalacia patellae, left knee: Secondary | ICD-10-CM | POA: Diagnosis not present

## 2018-02-21 DIAGNOSIS — Z87442 Personal history of urinary calculi: Secondary | ICD-10-CM | POA: Insufficient documentation

## 2018-02-21 DIAGNOSIS — W230XXA Caught, crushed, jammed, or pinched between moving objects, initial encounter: Secondary | ICD-10-CM | POA: Insufficient documentation

## 2018-02-21 DIAGNOSIS — S83232A Complex tear of medial meniscus, current injury, left knee, initial encounter: Secondary | ICD-10-CM | POA: Insufficient documentation

## 2018-02-21 DIAGNOSIS — F419 Anxiety disorder, unspecified: Secondary | ICD-10-CM | POA: Diagnosis not present

## 2018-02-21 DIAGNOSIS — M23322 Other meniscus derangements, posterior horn of medial meniscus, left knee: Secondary | ICD-10-CM

## 2018-02-21 DIAGNOSIS — S83282A Other tear of lateral meniscus, current injury, left knee, initial encounter: Secondary | ICD-10-CM | POA: Diagnosis not present

## 2018-02-21 DIAGNOSIS — S83242A Other tear of medial meniscus, current injury, left knee, initial encounter: Secondary | ICD-10-CM | POA: Diagnosis present

## 2018-02-21 HISTORY — DX: Personal history of urinary calculi: Z87.442

## 2018-02-21 HISTORY — DX: Headache: R51

## 2018-02-21 HISTORY — DX: Disease of stomach and duodenum, unspecified: K31.9

## 2018-02-21 HISTORY — DX: Horseshoe tear of retina without detachment, bilateral: H33.313

## 2018-02-21 HISTORY — DX: Headache, unspecified: R51.9

## 2018-02-21 HISTORY — DX: Unspecified injury of unspecified eye and orbit, initial encounter: S05.90XA

## 2018-02-21 HISTORY — DX: Carpal tunnel syndrome, left upper limb: G56.02

## 2018-02-21 HISTORY — PX: KNEE ARTHROSCOPY WITH MEDIAL MENISECTOMY: SHX5651

## 2018-02-21 HISTORY — DX: Radiculopathy, lumbar region: M54.16

## 2018-02-21 HISTORY — DX: Unspecified osteoarthritis, unspecified site: M19.90

## 2018-02-21 HISTORY — DX: Syncope and collapse: R55

## 2018-02-21 HISTORY — DX: Hypo-osmolality and hyponatremia: E87.1

## 2018-02-21 HISTORY — DX: Presence of dental prosthetic device (complete) (partial): Z97.2

## 2018-02-21 SURGERY — ARTHROSCOPY, KNEE, WITH MEDIAL MENISCECTOMY
Anesthesia: General | Site: Knee | Laterality: Left

## 2018-02-21 MED ORDER — ONDANSETRON HCL 4 MG/2ML IJ SOLN
INTRAMUSCULAR | Status: DC | PRN
  Administered 2018-02-21: 4 mg via INTRAVENOUS

## 2018-02-21 MED ORDER — PROPOFOL 10 MG/ML IV BOLUS
INTRAVENOUS | Status: DC | PRN
  Administered 2018-02-21: 200 mg via INTRAVENOUS

## 2018-02-21 MED ORDER — LIDOCAINE 2% (20 MG/ML) 5 ML SYRINGE
INTRAMUSCULAR | Status: AC
  Filled 2018-02-21: qty 5

## 2018-02-21 MED ORDER — PROPOFOL 10 MG/ML IV BOLUS
INTRAVENOUS | Status: AC
  Filled 2018-02-21: qty 20

## 2018-02-21 MED ORDER — EPINEPHRINE PF 1 MG/ML IJ SOLN
INTRAMUSCULAR | Status: DC | PRN
  Administered 2018-02-21: 2 mL

## 2018-02-21 MED ORDER — ONDANSETRON HCL 4 MG/2ML IJ SOLN
INTRAMUSCULAR | Status: AC
  Filled 2018-02-21: qty 2

## 2018-02-21 MED ORDER — LIDOCAINE 2% (20 MG/ML) 5 ML SYRINGE
INTRAMUSCULAR | Status: DC | PRN
  Administered 2018-02-21: 80 mg via INTRAVENOUS

## 2018-02-21 MED ORDER — OXYCODONE-ACETAMINOPHEN 5-325 MG PO TABS: 1.0000 | ORAL_TABLET | ORAL | 0 refills | Status: AC | PRN

## 2018-02-21 MED ORDER — LACTATED RINGERS IV SOLN
INTRAVENOUS | Status: DC
  Administered 2018-02-21: 12:00:00 via INTRAVENOUS
  Filled 2018-02-21: qty 1000

## 2018-02-21 MED ORDER — FENTANYL CITRATE (PF) 100 MCG/2ML IJ SOLN
INTRAMUSCULAR | Status: DC | PRN
  Administered 2018-02-21 (×2): 50 ug via INTRAVENOUS

## 2018-02-21 MED ORDER — DEXAMETHASONE SODIUM PHOSPHATE 10 MG/ML IJ SOLN
INTRAMUSCULAR | Status: DC | PRN
  Administered 2018-02-21: 10 mg via INTRAVENOUS

## 2018-02-21 MED ORDER — SODIUM CHLORIDE 0.9 % IR SOLN
Status: DC | PRN
  Administered 2018-02-21: 6000 mL

## 2018-02-21 MED ORDER — PROMETHAZINE HCL 25 MG/ML IJ SOLN
6.2500 mg | INTRAMUSCULAR | Status: DC | PRN
  Filled 2018-02-21: qty 1

## 2018-02-21 MED ORDER — CEFAZOLIN SODIUM-DEXTROSE 2-4 GM/100ML-% IV SOLN
2.0000 g | INTRAVENOUS | Status: AC
  Administered 2018-02-21: 2 g via INTRAVENOUS
  Filled 2018-02-21: qty 100

## 2018-02-21 MED ORDER — OXYCODONE HCL 5 MG PO TABS
10.0000 mg | ORAL_TABLET | Freq: Once | ORAL | Status: AC | PRN
  Administered 2018-02-21: 5 mg via ORAL
  Filled 2018-02-21: qty 2

## 2018-02-21 MED ORDER — OXYCODONE HCL 5 MG/5ML PO SOLN
10.0000 mg | Freq: Once | ORAL | Status: AC | PRN
  Filled 2018-02-21: qty 10

## 2018-02-21 MED ORDER — FENTANYL CITRATE (PF) 100 MCG/2ML IJ SOLN
25.0000 ug | INTRAMUSCULAR | Status: DC | PRN
  Filled 2018-02-21: qty 1

## 2018-02-21 MED ORDER — FENTANYL CITRATE (PF) 100 MCG/2ML IJ SOLN
INTRAMUSCULAR | Status: AC
  Filled 2018-02-21: qty 2

## 2018-02-21 MED ORDER — MIDAZOLAM HCL 2 MG/2ML IJ SOLN
INTRAMUSCULAR | Status: AC
  Filled 2018-02-21: qty 2

## 2018-02-21 MED ORDER — BUPIVACAINE-EPINEPHRINE 0.25% -1:200000 IJ SOLN
INTRAMUSCULAR | Status: DC | PRN
  Administered 2018-02-21: 27 mL

## 2018-02-21 MED ORDER — OXYCODONE HCL 5 MG PO TABS
ORAL_TABLET | ORAL | Status: AC
  Filled 2018-02-21: qty 1

## 2018-02-21 MED ORDER — CEFAZOLIN SODIUM-DEXTROSE 2-4 GM/100ML-% IV SOLN
INTRAVENOUS | Status: AC
  Filled 2018-02-21: qty 100

## 2018-02-21 MED ORDER — MIDAZOLAM HCL 2 MG/2ML IJ SOLN
INTRAMUSCULAR | Status: DC | PRN
  Administered 2018-02-21: 2 mg via INTRAVENOUS

## 2018-02-21 MED ORDER — CHLORHEXIDINE GLUCONATE 4 % EX LIQD
60.0000 mL | Freq: Once | CUTANEOUS | Status: DC
  Filled 2018-02-21: qty 118

## 2018-02-21 MED ORDER — EPHEDRINE SULFATE-NACL 50-0.9 MG/10ML-% IV SOSY
PREFILLED_SYRINGE | INTRAVENOUS | Status: DC | PRN
  Administered 2018-02-21: 10 mg via INTRAVENOUS

## 2018-02-21 MED ORDER — DEXAMETHASONE SODIUM PHOSPHATE 10 MG/ML IJ SOLN
INTRAMUSCULAR | Status: AC
  Filled 2018-02-21: qty 1

## 2018-02-21 MED ORDER — ACETAMINOPHEN 10 MG/ML IV SOLN
1000.0000 mg | Freq: Once | INTRAVENOUS | Status: DC | PRN
  Administered 2018-02-21: 1000 mg via INTRAVENOUS
  Filled 2018-02-21: qty 100

## 2018-02-21 MED ORDER — ACETAMINOPHEN 10 MG/ML IV SOLN
INTRAVENOUS | Status: AC
  Filled 2018-02-21: qty 100

## 2018-02-21 MED ORDER — EPHEDRINE 5 MG/ML INJ
INTRAVENOUS | Status: AC
  Filled 2018-02-21: qty 10

## 2018-02-21 MED FILL — OXYCODONE-ACETAMINOPHEN 5-3: 5-325 | 4 days supply | Qty: 20 | Fill #0

## 2018-02-21 SURGICAL SUPPLY — 46 items
BANDAGE ACE 4X5 VEL STRL LF (GAUZE/BANDAGES/DRESSINGS) ×2 IMPLANT
BANDAGE ACE 6X5 VEL STRL LF (GAUZE/BANDAGES/DRESSINGS) ×3 IMPLANT
BLADE 11 SAFETY STRL DISP (BLADE) IMPLANT
BLADE CUDA SHAVER 3.5 (BLADE) IMPLANT
BNDG COHESIVE 6X5 TAN NS LF (GAUZE/BANDAGES/DRESSINGS) ×3 IMPLANT
BOOTIES KNEE HIGH SLOAN (MISCELLANEOUS) ×6 IMPLANT
CANNULA ACUFLEX KIT 5X76 (CANNULA) IMPLANT
COVER WAND RF STERILE (DRAPES) ×3 IMPLANT
DRAPE ARTHROSCOPY W/POUCH 114 (DRAPES) ×3 IMPLANT
DRAPE SHEET LG 3/4 BI-LAMINATE (DRAPES) ×3 IMPLANT
DRSG PAD ABDOMINAL 8X10 ST (GAUZE/BANDAGES/DRESSINGS) ×3 IMPLANT
DURAPREP 26ML APPLICATOR (WOUND CARE) ×3 IMPLANT
ELECT MENISCUS 165MM 90D (ELECTRODE) IMPLANT
ELECT REM PT RETURN 9FT ADLT (ELECTROSURGICAL)
ELECTRODE REM PT RTRN 9FT ADLT (ELECTROSURGICAL) IMPLANT
GAUZE SPONGE 4X4 12PLY STRL (GAUZE/BANDAGES/DRESSINGS) ×2 IMPLANT
GLOVE BIOGEL PI IND STRL 7.0 (GLOVE) ×1 IMPLANT
GLOVE BIOGEL PI INDICATOR 7.0 (GLOVE) ×2
GLOVE SURG SS PI 7.0 STRL IVOR (GLOVE) ×3 IMPLANT
GLOVE SURG SS PI 8.0 STRL IVOR (GLOVE) ×3 IMPLANT
GOWN STRL REUS W/ TWL XL LVL3 (GOWN DISPOSABLE) ×2 IMPLANT
GOWN STRL REUS W/TWL XL LVL3 (GOWN DISPOSABLE) ×6
IV NS IRRIG 3000ML ARTHROMATIC (IV SOLUTION) ×6 IMPLANT
KIT TURNOVER CYSTO (KITS) ×3 IMPLANT
KNEE WRAP E Z 3 GEL PACK (MISCELLANEOUS) ×3 IMPLANT
MANIFOLD NEPTUNE II (INSTRUMENTS) ×3 IMPLANT
NDL FILTER BLUNT 18X1 1/2 (NEEDLE) ×1 IMPLANT
NDL SAFETY ECLIPSE 18X1.5 (NEEDLE) ×1 IMPLANT
NEEDLE FILTER BLUNT 18X 1/2SAF (NEEDLE) ×2
NEEDLE FILTER BLUNT 18X1 1/2 (NEEDLE) ×1 IMPLANT
NEEDLE HYPO 18GX1.5 SHARP (NEEDLE) ×3
PACK ARTHROSCOPY DSU (CUSTOM PROCEDURE TRAY) ×3 IMPLANT
PACK BASIN DAY SURGERY FS (CUSTOM PROCEDURE TRAY) ×3 IMPLANT
PADDING CAST COTTON 6X4 STRL (CAST SUPPLIES) ×3 IMPLANT
PROBE BIPOLAR 50 DEGREE SUCT (MISCELLANEOUS) ×3 IMPLANT
RESECTOR FULL RADIUS 4.2MM (BLADE) IMPLANT
SET ARTHROSCOPY TUBING (MISCELLANEOUS) ×3
SET ARTHROSCOPY TUBING LN (MISCELLANEOUS) ×1 IMPLANT
SHAVER 4.2 MM LANZA 9391A (BLADE) ×3 IMPLANT
SUT ETHILON 4 0 PS 2 18 (SUTURE) ×3 IMPLANT
SYR 10ML LL (SYRINGE) ×3 IMPLANT
SYR 30ML LL (SYRINGE) ×3 IMPLANT
TOWEL OR 17X24 6PK STRL BLUE (TOWEL DISPOSABLE) ×3 IMPLANT
TUBE CONNECTING 12'X1/4 (SUCTIONS)
TUBE CONNECTING 12X1/4 (SUCTIONS) IMPLANT
WATER STERILE IRR 500ML POUR (IV SOLUTION) ×3 IMPLANT

## 2018-02-21 NOTE — Anesthesia Postprocedure Evaluation (Signed)
Anesthesia Post Note  Patient: Robert Andersen  Procedure(s) Performed: Left knee arthroscopy, partial medial and lateral menisectomy, debridement (Left Knee)     Patient location during evaluation: PACU Anesthesia Type: General Level of consciousness: awake and alert, oriented and patient cooperative Pain management: pain level controlled Vital Signs Assessment: post-procedure vital signs reviewed and stable Respiratory status: spontaneous breathing, nonlabored ventilation and respiratory function stable Cardiovascular status: blood pressure returned to baseline and stable Postop Assessment: no apparent nausea or vomiting and adequate PO intake Anesthetic complications: no    Last Vitals:  Vitals:   02/21/18 1500 02/21/18 1515  BP: 123/82 138/88  Pulse: 88 94  Resp: 15 17  Temp:    SpO2: 97% 95%    Last Pain:  Vitals:   02/21/18 1515  TempSrc:   PainSc: 4                  Elyon Zoll,E. Kip Kautzman

## 2018-02-21 NOTE — Discharge Instructions (Signed)
ARTHROSCOPIC KNEE SURGERY HOME CARE INSTRUCTIONS   PAIN You will be expected to have a moderate amount of pain in the affected knee for approximately two weeks.  However, the first two to four days will be the most severe in terms of the pain you will experience.  Prescriptions have been provided for you to take as needed for the pain.  The pain can be markedly reduced by using the ice/compressive bandage given.  Exchange the ice packs whenever they thaw.  During the night, keep the bandage on because it will still provide some compression for the swelling.  Also, keep the leg elevated on pillows above your heart, and this will help alleviate the pain and swelling.  MEDICATION Prescriptions have been provided to take as needed for pain. To prevent blood clots, take Aspirin 325mg  daily with a meal if not on a blood thinner and if no history of stomach ulcers.  ACTIVITY It is preferred that you stay on bedrest for approximately 24 hours.  However, you may go to the bathroom with help.  After this, you can start to be up and about progressively more.  Remember that the swelling may still increase after three to four days if you are up and doing too much.  You may put as much weight on the affected leg as pain will allow.  Use your crutches for comfort and safety.  However, as soon as you are able, you may discard the crutches and go without them.   DRESSING Keep the current dressing as dry as possible.  Two days after your surgery, you may remove the ice/compressive wrap, and surgical dressing.  You may now take a shower, but do not scrub the sounds directly with soap.  Let water rinse over these and gently wipe with your hand.  Reapply band-aids over the puncture wounds and more gauze if needed.  A slight amount of thin drainage can be normal at this time, and do not let it frighten you.  Reapply the ice/compressive wrap.  You may now repeat this every day each time you shower.  SYMPTOMS TO REPORT TO  YOUR DOCTOR  -Extreme pain.  -Extreme swelling.  -Temperature above 101 degrees that does not come down with acetaminophen     (Tylenol).  -Any changes in the feeling, color or movement of your toes.  -Extreme redness, heat, swelling or drainage at your incision  EXERCISE It is preferred that you begin to exercise on the day of your surgery.  Straight leg raises and short arc quads should be begun the afternoon or evening of surgery and continued until you come back for your follow-up appointment.   Attached is an instruction sheet on how to perform these two simple exercises.  Do these at least three times per day if not more.  You may bend your knee as much as is comfortable.  The puncture wounds may occasionally be slightly uncomfortable with bending of the knee.  Do not let this frighten you.  It is important to keep your knee motion, but do not overdo it.  If you have significant pain, simply do not bend the knee as far.   You will be given more exercises to perform at your first return visit.    RETURN APPOINTMENT Please make an appointment to be seen by your doctor in 10-14 days from your surgery.  Patient Signature:  ________________________________________________________  Nurse's Signature:  ________________________________________________________    Post Anesthesia Home Care Instructions  Activity: Get plenty  of rest for the remainder of the day. A responsible adult should stay with you for 24 hours following the procedure.  For the next 24 hours, DO NOT: -Drive a car -Paediatric nurse -Drink alcoholic beverages -Take any medication unless instructed by your physician -Make any legal decisions or sign important papers.  Meals: Start with liquid foods such as gelatin or soup. Progress to regular foods as tolerated. Avoid greasy, spicy, heavy foods. If nausea and/or vomiting occur, drink only clear liquids until the nausea and/or vomiting subsides. Call your physician if  vomiting continues.  Special Instructions/Symptoms: Your throat may feel dry or sore from the anesthesia or the breathing tube placed in your throat during surgery. If this causes discomfort, gargle with warm salt water. The discomfort should disappear within 24 hours.  If you had a scopolamine patch placed behind your ear for the management of post- operative nausea and/or vomiting:  1. The medication in the patch is effective for 72 hours, after which it should be removed.  Wrap patch in a tissue and discard in the trash. Wash hands thoroughly with soap and water. 2. You may remove the patch earlier than 72 hours if you experience unpleasant side effects which may include dry mouth, dizziness or visual disturbances. 3. Avoid touching the patch. Wash your hands with soap and water after contact with the patch.

## 2018-02-21 NOTE — Progress Notes (Signed)
Patient had received tylenol iv and oxycodone ir for headache and surgical pain.  Knee pain appeared to be under control but he still stated that headache the same.   [place] anesthesia doctor was call regarding headache.. She referred me to [place] who felt like the patient would be ok to go home.  Vital signs were stable.  Patients wife stated that he get headaches sometime and that going to sleep helps.

## 2018-02-21 NOTE — Brief Op Note (Signed)
02/21/2018  2:10 PM  PATIENT:  Robert Andersen  57 y.o. male  PRE-OPERATIVE DIAGNOSIS:  Left knee pain, medial meniscus tear, chondromalacia patella  POST-OPERATIVE DIAGNOSIS:  Left knee pain, medial meniscus tear, chondromalacia patella  PROCEDURE:  Procedure(s) with comments: Left knee arthroscopy, partial medial and lateral menisectomy, debridement (Left) - 60 mins  SURGEON:  Surgeon(s) and Role:    Susa Day, MD - Primary  PHYSICIAN ASSISTANT:   ASSISTANTS: Bissell   ANESTHESIA:   general  EBL:  min   BLOOD ADMINISTERED:none  DRAINS: none   LOCAL MEDICATIONS USED:  MARCAINE     SPECIMEN:  No Specimen  DISPOSITION OF SPECIMEN:  N/A  COUNTS:  YES  TOURNIQUET:  * No tourniquets in log *  DICTATION: .Other Dictation: Dictation Number [id number]  PLAN OF CARE: Discharge to home after PACU  PATIENT DISPOSITION:  PACU - hemodynamically stable.   Delay start of Pharmacological VTE agent (>24hrs) due to surgical blood loss or risk of bleeding: no

## 2018-02-21 NOTE — Transfer of Care (Signed)
Immediate Anesthesia Transfer of Care Note  Patient: Robert Andersen  Procedure(s) Performed: Procedure(s) (LRB): Left knee arthroscopy, partial medial and lateral menisectomy, debridement (Left)  Patient Location: PACU  Anesthesia Type: General  Level of Consciousness: awake, oriented, sedated and patient cooperative  Airway & Oxygen Therapy: Patient Spontanous Breathing and Patient connected to face mask oxygen  Post-op Assessment: Report given to PACU RN and Post -op Vital signs reviewed and stable  Post vital signs: Reviewed and stable  Complications: No apparent anesthesia complications Last Vitals:  Vitals Value Taken Time  BP    Temp    Pulse 104 02/21/2018  2:21 PM  Resp    SpO2 96 % 02/21/2018  2:21 PM  Vitals shown include unvalidated device data.  Last Pain:  Vitals:   02/21/18 1126  TempSrc:   PainSc: 3       Patients Stated Pain Goal: 4 (02/21/18 1126)

## 2018-02-21 NOTE — Anesthesia Preprocedure Evaluation (Addendum)
Anesthesia Evaluation  Patient identified by MRN, date of birth, ID band Patient awake    Reviewed: Allergy & Precautions, NPO status , Patient's Chart, lab work & pertinent test results  History of Anesthesia Complications Negative for: history of anesthetic complications  Airway Mallampati: II  TM Distance: >3 FB Neck ROM: Full    Dental no notable dental hx. (+) Upper Dentures, Poor Dentition, Missing,    Pulmonary neg pulmonary ROS,    Pulmonary exam normal breath sounds clear to auscultation       Cardiovascular negative cardio ROS Normal cardiovascular exam Rhythm:Regular Rate:Normal     Neuro/Psych  Headaches, Anxiety Lumbar radiculopathy    GI/Hepatic negative GI ROS, Neg liver ROS,   Endo/Other  negative endocrine ROS  Renal/GU negative Renal ROS     Musculoskeletal  (+) Arthritis , Osteoarthritis,    Abdominal   Peds  Hematology negative hematology ROS (+)   Anesthesia Other Findings Day of surgery medications reviewed with the patient.  Reproductive/Obstetrics                            Anesthesia Physical Anesthesia Plan  ASA: II  Anesthesia Plan: General   Post-op Pain Management:    Induction: Intravenous  PONV Risk Score and Plan: 2 and Ondansetron, Dexamethasone, Treatment may vary due to age or medical condition and Midazolam  Airway Management Planned: LMA  Additional Equipment:   Intra-op Plan:   Post-operative Plan: Extubation in OR  Informed Consent: I have reviewed the patients History and Physical, chart, labs and discussed the procedure including the risks, benefits and alternatives for the proposed anesthesia with the patient or authorized representative who has indicated his/her understanding and acceptance.   Dental advisory given  Plan Discussed with: CRNA  Anesthesia Plan Comments:        Anesthesia Quick Evaluation

## 2018-02-21 NOTE — Interval H&P Note (Signed)
History and Physical Interval Note:  02/21/2018 1:13 PM  Robert Andersen  has presented today for surgery, with the diagnosis of Left knee pain, medial meniscus tear, chondromalacia patella  The various methods of treatment have been discussed with the patient and family. After consideration of risks, benefits and other options for treatment, the patient has consented to  Procedure(s) with comments: Left knee arthroscopy, partial medial menisectomy, debridement, possible lateral release (Left) - 60 mins as a surgical intervention .  The patient's history has been reviewed, patient examined, no change in status, stable for surgery.  I have reviewed the patient's chart and labs.  Questions were answered to the patient's satisfaction.     [name]

## 2018-02-21 NOTE — Anesthesia Procedure Notes (Signed)
Procedure Name: LMA Insertion Date/Time: 02/21/2018 1:25 PM Performed by: Suan Halter, CRNA Pre-anesthesia Checklist: Patient identified, Emergency Drugs available, Suction available and Patient being monitored Patient Re-evaluated:Patient Re-evaluated prior to induction Oxygen Delivery Method: Circle system utilized Preoxygenation: Pre-oxygenation with 100% oxygen Induction Type: IV induction Ventilation: Mask ventilation without difficulty LMA: LMA inserted LMA Size: 4.0 Number of attempts: 1 Airway Equipment and Method: Bite block Placement Confirmation: positive ETCO2 Tube secured with: Tape Dental Injury: Teeth and Oropharynx as per pre-operative assessment

## 2018-02-22 NOTE — Op Note (Signed)
Robert Andersen, Robert Andersen MEDICAL RECORD [id number] ACCOUNT [id number] DATE OF BIRTH:[birth year] FACILITY: [facility] LOCATION: [facility] PHYSICIAN:Rachelle Edwards Windy Kalata, MD  OPERATIVE REPORT  DATE OF PROCEDURE:  02/21/2018  PREOPERATIVE DIAGNOSES: 1.  Medial meniscus tear. 2.  Posttraumatic chondromalacia of the left knee.  POSTOPERATIVE DIAGNOSES: 1.  Medial meniscus tear. 2.  Lateral meniscus tear. 3.  Grade III chondromalacia patellofemoral joint.  PROCEDURE PERFORMED: 1.  Left knee arthroscopy. 2.  Partial medial meniscectomy. 3.  Partial lateral meniscectomy. 4.  Chondroplasty patellofemoral joint.  ANESTHESIA:  General.  ASSISTANT:  Lacie Draft, PA  HISTORY:  A 57 year old male who fell onto his knee at work and had persistent patellofemoral pain and mechanical symptoms, locking, giving way.  Lateral joint line pain, patellofemoral pain, MRI indicating medial meniscus tear.  He had been refractory  to conservative treatment.  Due to mechanical symptoms, we discussed proceeding with knee arthroscopy, debridement, partial meniscectomy and evaluation.  Risks and benefits discussed including bleeding, infection, damage to neurovascular structures, no  change in symptoms, worsening symptoms, DVT, PE, anesthetic complications, etc.  DESCRIPTION OF PROCEDURE:  With the patient in supine position after induction of adequate general anesthesia and 2 g Kefzol, left lower extremity was prepped and draped in the usual sterile fashion.  A lateral parapatellar portal was fashioned with a  #11 blade.  An Egress cannula atraumatically placed.  Irrigant was utilized to insufflate the joint.  Under direct visualization, a medial parapatellar portal was fashioned with a #11 blade after localization with an 18-gauge needle, sparing the medial  meniscus.  Noted was a complex tear of the medial meniscus involving the posterior third junction of the middle and posterior third.  I introduced a combination  of a basket shaver and an ArthroWand to resect approximately one-third of that posterior  third and junction into the middle third of the meniscus.  The remnant then was stable to probe palpation.  No grade IV lesions were noted medially.  There was some loose cartilaginous debris that was debrided as well.  ACL was unremarkable.  Lateral compartment revealed radial tearing of the mid third of the lateral meniscus.  Introduced a shaver and shaved these radial tears to the stable base.  The remnant was stable to probe palpation.  No significant chondral  injury or chondral changes were noted in the femoral condyle or tibial plateau.  Suprapatellar pouch revealed grade III changes of the patella and particularly of the femoral sulcus with chondral flap tears.  A combination of baskets was utilized to resect the chondral flap tear and the sulcus to further contour with the shaver.   There was normal patellofemoral tracking.  The gutters were unremarkable.  No plica.  We revisited all compartments.  No further pathology amenable to arthroscopic intervention.  I therefore removed all instrumentation.  Portals were closed with 4-0 nylon simple sutures.  Marcaine 0.25% with epinephrine was infiltrated in the joint.  The  wound was dressed sterilely, awoken without difficulty and transported to the recovery room in satisfactory condition.  The patient tolerated the procedure well.  No complications.  Assistant Lacie Draft, [place].  Minimal blood loss.  LN/NUANCE  D:02/21/2018 T:02/22/2018 JOB:[id number]

## 2018-02-24 ENCOUNTER — Encounter (HOSPITAL_BASED_OUTPATIENT_CLINIC_OR_DEPARTMENT_OTHER): Payer: Self-pay | Admitting: Specialist

## 2018-04-16 DIAGNOSIS — J209 Acute bronchitis, unspecified: Secondary | ICD-10-CM | POA: Diagnosis not present

## 2018-04-16 DIAGNOSIS — R05 Cough: Secondary | ICD-10-CM | POA: Diagnosis not present

## 2018-04-23 DIAGNOSIS — J209 Acute bronchitis, unspecified: Secondary | ICD-10-CM | POA: Diagnosis not present

## 2018-07-04 DIAGNOSIS — G4700 Insomnia, unspecified: Secondary | ICD-10-CM | POA: Diagnosis not present

## 2018-07-04 DIAGNOSIS — F419 Anxiety disorder, unspecified: Secondary | ICD-10-CM | POA: Diagnosis not present

## 2018-07-04 DIAGNOSIS — Z0000 Encounter for general adult medical examination without abnormal findings: Secondary | ICD-10-CM | POA: Diagnosis not present

## 2018-07-04 DIAGNOSIS — E782 Mixed hyperlipidemia: Secondary | ICD-10-CM | POA: Diagnosis not present

## 2018-07-04 DIAGNOSIS — Z125 Encounter for screening for malignant neoplasm of prostate: Secondary | ICD-10-CM | POA: Diagnosis not present

## 2018-07-04 DIAGNOSIS — G5602 Carpal tunnel syndrome, left upper limb: Secondary | ICD-10-CM | POA: Diagnosis not present

## 2018-07-04 DIAGNOSIS — Z8659 Personal history of other mental and behavioral disorders: Secondary | ICD-10-CM | POA: Diagnosis not present

## 2021-05-01 DIAGNOSIS — B09 Unspecified viral infection characterized by skin and mucous membrane lesions: Secondary | ICD-10-CM | POA: Diagnosis not present

## 2021-05-04 ENCOUNTER — Other Ambulatory Visit: Payer: Self-pay

## 2021-05-04 ENCOUNTER — Ambulatory Visit
Admission: RE | Admit: 2021-05-04 | Discharge: 2021-05-04 | Disposition: A | Discharge status: Home / Self Care | Payer: Medicare HMO | Source: Ambulatory Visit | Attending: Physician Assistant | Admitting: Physician Assistant

## 2021-05-04 VITALS — BP 129/86 | HR 85 | Temp 98.1°F | Resp 18

## 2021-05-04 DIAGNOSIS — T7840XA Allergy, unspecified, initial encounter: Secondary | ICD-10-CM | POA: Diagnosis not present

## 2021-05-04 MED ORDER — PREDNISONE 50 MG PO TABS: ORAL_TABLET | ORAL | 0 refills | Status: AC

## 2021-05-04 MED ORDER — METHYLPREDNISOLONE SODIUM SUCC 125 MG IJ SOLR
125.0000 mg | Freq: Once | INTRAMUSCULAR | Status: AC
  Administered 2021-05-04: 125 mg via INTRAMUSCULAR

## 2021-05-04 NOTE — ED Triage Notes (Signed)
Pt has a rash all over his body that started on 04/30/21. He was seen at an Urgent Care the next day and advised to take Benadryl the rash is not improving.

## 2021-05-04 NOTE — ED Provider Notes (Signed)
[name]    CSN: [id number] Arrival date & time: 05/04/21  8119      History   Chief Complaint Chief Complaint  Patient presents with   Rash    HPI BENTLEIGH STANKUS is a 61 y.o. male.   Pt has a shrimp allergy,  rash began after eating a dish that may have had shrimp in it.  Pt was seen at a walk in clinic and told rash was viral  Rash is worse  The history is provided by the patient. No language interpreter was used.  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   pt unaware   Eye injuries    trauma bilateral   Headache    History of kidney stones 1995   Hyponatremia 2009   Lumbar radiculopathy 11/2015   Retinal tear, bilateral    after trauma   Stomach disorder 2009   stomach and intestines shutdown, hospitalized for a week   Syncope 2017   Wears dentures    upper    Patient Active Problem List   Diagnosis Date Noted   Lumbosacral radiculopathy 12/05/2015   Carpal tunnel syndrome of left wrist 12/05/2015    Past Surgical History:  Procedure Laterality Date   CYSTOSCOPY W/ URETERAL [place] Bilateral 2017   Lens replaced   KNEE ARTHROSCOPY WITH MEDIAL MENISECTOMY Left 02/21/2018   Procedure: Left knee arthroscopy, partial medial and lateral menisectomy, debridement;  Surgeon: Susa Day, MD;  Location: [facility];  Service: Orthopedics;  Laterality: Left;  60 mins   REFRACTIVE SURGERY         Home Medications    Prior to Admission medications   Medication Sig Start Date End Date Taking? Authorizing Provider   ALPRAZolam (XANAX) 1 MG tablet TAKE 1 TABLET BY MOUTH DAILY AS NEEDED FOR STRESS 05/14/15  Yes [provider]  aspirin 81 MG tablet Take 81 mg by mouth daily.   Yes [provider]  gabapentin (NEURONTIN) 300 MG capsule Take 300 mg by mouth 3 (three) times daily.    [provider]  Multiple Vitamin (MULTIVITAMIN) capsule Take 1 capsule by mouth daily.    [provider]  naproxen sodium (ALEVE) 220 MG tablet Take 220 mg by mouth.    [provider]  oxyCODONE-acetaminophen (PERCOCET) 5-325 MG tablet Take 1 tablet by mouth every 4 (four) hours as needed for severe pain. 02/21/18   Susa Day, MD  pseudoephedrine (SUDAFED) 60 MG tablet Take 120 mg by mouth every 4 (four) hours as needed for congestion.    [provider]    Family History Family History  Problem Relation Age of Onset   Colon cancer Mother    Heart attack Father    Diabetes Father    Healthy Son     Social History Social History   Tobacco Use   Smoking status: Never   Smokeless tobacco: Never  Vaping Use   Vaping Use: Never used  Substance Use Topics   Alcohol use: No  Alcohol/week: 0.0 standard drinks   Drug use: No     Allergies   Meloxicam and Shrimp (diagnostic)   Review of Systems Review of Systems  Constitutional:  Negative for fever.  Gastrointestinal:  Negative for abdominal pain.  Skin:  Positive for rash.  All other systems reviewed and are negative.   Physical Exam Triage Vital Signs ED Triage Vitals  Enc Vitals Group     BP 05/04/21 0943 129/86     Pulse Rate 05/04/21 0943 85     Resp 05/04/21 0943 18     Temp 05/04/21 0943 98.1 F (36.7 C)     Temp Source 05/04/21 0943 Oral     SpO2 05/04/21 0943 96 %     Weight --      Height --      Head Circumference --      Peak Flow --      Pain Score 05/04/21 0941 0     Pain Loc --      Pain Edu? --      Excl. in [place]? --    No data found.  Updated Vital Signs BP 129/86 (BP  Location: Right Arm)    Pulse 85    Temp 98.1 F (36.7 C) (Oral)    Resp 18    SpO2 96%   Visual Acuity Right Eye Distance:   Left Eye Distance:   Bilateral Distance:    Right Eye Near:   Left Eye Near:    Bilateral Near:     Physical Exam Vitals and nursing note reviewed.  Constitutional:      Appearance: He is well-developed.  HENT:     Head: Normocephalic.     Mouth/Throat:     Pharynx: Oropharynx is clear.  Cardiovascular:     Rate and Rhythm: Normal rate.  Pulmonary:     Effort: Pulmonary effort is normal.  Abdominal:     General: There is no distension.  Musculoskeletal:        General: Normal range of motion.     Cervical back: Normal range of motion.  Skin:    General: Skin is warm.     Findings: Rash present.     Comments: Dark red rash full body,  Neurological:     Mental Status: He is alert and oriented to person, place, and time.  Psychiatric:        Mood and Affect: Mood normal.     UC Treatments / Results  Labs (all labs ordered are listed, but only abnormal results are displayed) Labs Reviewed - No data to display  EKG   Radiology No results found.  Procedures Procedures (including critical care time)  Medications Ordered in UC Medications  methylPREDNISolone sodium succinate (SOLU-MEDROL) 125 mg/2 mL injection 125 mg (has no administration in time range)    Initial Impression / Assessment and Plan / UC Course  I have reviewed the triage vital signs and the nursing notes.  Pertinent labs & imaging results that were available during my care of the patient were reviewed by me and considered in my medical decision making (see chart for details).     MDM:  I suspect allergic rash.  Pt given solumedrol here and rx for prednisone  Final Clinical Impressions(s) / UC Diagnoses   Final diagnoses:  Allergic reaction, initial encounter   Discharge Instructions   None    ED Prescriptions     Medication Sig Dispense Auth. Provider    predniSONE (DELTASONE) 50 MG  tablet One tablet a day 5 tablet Fransico Meadow, [place]      PDMP not reviewed this encounter.   Fransico Meadow, [place] 05/04/21 1002

## 2021-05-09 DIAGNOSIS — Z91013 Allergy to seafood: Secondary | ICD-10-CM | POA: Diagnosis not present

## 2021-05-09 DIAGNOSIS — M25562 Pain in left knee: Secondary | ICD-10-CM | POA: Diagnosis not present

## 2021-05-09 DIAGNOSIS — L309 Dermatitis, unspecified: Secondary | ICD-10-CM | POA: Diagnosis not present

## 2021-05-11 DIAGNOSIS — Z961 Presence of intraocular lens: Secondary | ICD-10-CM | POA: Diagnosis not present

## 2021-05-11 DIAGNOSIS — H33323 Round hole, bilateral: Secondary | ICD-10-CM | POA: Diagnosis not present

## 2021-05-11 DIAGNOSIS — H524 Presbyopia: Secondary | ICD-10-CM | POA: Diagnosis not present

## 2021-05-11 DIAGNOSIS — H04123 Dry eye syndrome of bilateral lacrimal glands: Secondary | ICD-10-CM | POA: Diagnosis not present

## 2021-05-15 DIAGNOSIS — Z91013 Allergy to seafood: Secondary | ICD-10-CM | POA: Diagnosis not present

## 2021-05-15 DIAGNOSIS — L309 Dermatitis, unspecified: Secondary | ICD-10-CM | POA: Diagnosis not present

## 2021-05-19 DIAGNOSIS — L308 Other specified dermatitis: Secondary | ICD-10-CM | POA: Diagnosis not present

## 2021-05-19 DIAGNOSIS — D485 Neoplasm of uncertain behavior of skin: Secondary | ICD-10-CM | POA: Diagnosis not present

## 2021-05-19 DIAGNOSIS — R21 Rash and other nonspecific skin eruption: Secondary | ICD-10-CM | POA: Diagnosis not present

## 2021-05-31 DIAGNOSIS — R21 Rash and other nonspecific skin eruption: Secondary | ICD-10-CM | POA: Diagnosis not present

## 2021-06-02 DIAGNOSIS — Z79899 Other long term (current) drug therapy: Secondary | ICD-10-CM | POA: Diagnosis not present

## 2021-06-02 DIAGNOSIS — Z79631 Long term (current) use of antimetabolite agent: Secondary | ICD-10-CM | POA: Diagnosis not present

## 2021-06-02 DIAGNOSIS — Z113 Encounter for screening for infections with a predominantly sexual mode of transmission: Secondary | ICD-10-CM | POA: Diagnosis not present

## 2021-06-02 DIAGNOSIS — R590 Localized enlarged lymph nodes: Secondary | ICD-10-CM | POA: Diagnosis not present

## 2021-06-02 DIAGNOSIS — Z1159 Encounter for screening for other viral diseases: Secondary | ICD-10-CM | POA: Diagnosis not present

## 2021-06-02 DIAGNOSIS — L539 Erythematous condition, unspecified: Secondary | ICD-10-CM | POA: Diagnosis not present

## 2021-06-02 DIAGNOSIS — Z7952 Long term (current) use of systemic steroids: Secondary | ICD-10-CM | POA: Diagnosis not present

## 2021-06-02 DIAGNOSIS — Z5181 Encounter for therapeutic drug level monitoring: Secondary | ICD-10-CM | POA: Diagnosis not present

## 2021-06-14 DIAGNOSIS — L539 Erythematous condition, unspecified: Secondary | ICD-10-CM | POA: Diagnosis not present

## 2021-06-14 DIAGNOSIS — Z5181 Encounter for therapeutic drug level monitoring: Secondary | ICD-10-CM | POA: Diagnosis not present

## 2021-06-21 DIAGNOSIS — L539 Erythematous condition, unspecified: Secondary | ICD-10-CM | POA: Diagnosis not present

## 2021-06-28 ENCOUNTER — Ambulatory Visit: Payer: BLUE CROSS/BLUE SHIELD | Admitting: Internal Medicine

## 2021-06-28 DIAGNOSIS — Z5181 Encounter for therapeutic drug level monitoring: Secondary | ICD-10-CM | POA: Diagnosis not present

## 2021-06-28 DIAGNOSIS — L539 Erythematous condition, unspecified: Secondary | ICD-10-CM | POA: Diagnosis not present

## 2021-06-30 DIAGNOSIS — L309 Dermatitis, unspecified: Secondary | ICD-10-CM | POA: Diagnosis not present

## 2021-06-30 DIAGNOSIS — F419 Anxiety disorder, unspecified: Secondary | ICD-10-CM | POA: Diagnosis not present

## 2021-06-30 DIAGNOSIS — N529 Male erectile dysfunction, unspecified: Secondary | ICD-10-CM | POA: Diagnosis not present

## 2021-06-30 DIAGNOSIS — G5602 Carpal tunnel syndrome, left upper limb: Secondary | ICD-10-CM | POA: Diagnosis not present

## 2021-06-30 DIAGNOSIS — E782 Mixed hyperlipidemia: Secondary | ICD-10-CM | POA: Diagnosis not present

## 2021-06-30 DIAGNOSIS — M25562 Pain in left knee: Secondary | ICD-10-CM | POA: Diagnosis not present

## 2021-06-30 DIAGNOSIS — G4700 Insomnia, unspecified: Secondary | ICD-10-CM | POA: Diagnosis not present

## 2021-06-30 DIAGNOSIS — M5416 Radiculopathy, lumbar region: Secondary | ICD-10-CM | POA: Diagnosis not present

## 2021-06-30 DIAGNOSIS — Z8659 Personal history of other mental and behavioral disorders: Secondary | ICD-10-CM | POA: Diagnosis not present

## 2021-07-05 DIAGNOSIS — L539 Erythematous condition, unspecified: Secondary | ICD-10-CM | POA: Diagnosis not present

## 2021-07-12 DIAGNOSIS — L539 Erythematous condition, unspecified: Secondary | ICD-10-CM | POA: Diagnosis not present

## 2021-07-12 DIAGNOSIS — R7989 Other specified abnormal findings of blood chemistry: Secondary | ICD-10-CM | POA: Diagnosis not present

## 2021-07-12 DIAGNOSIS — Z5181 Encounter for therapeutic drug level monitoring: Secondary | ICD-10-CM | POA: Diagnosis not present

## 2021-08-09 DIAGNOSIS — L539 Erythematous condition, unspecified: Secondary | ICD-10-CM | POA: Diagnosis not present

## 2021-08-09 DIAGNOSIS — Z5181 Encounter for therapeutic drug level monitoring: Secondary | ICD-10-CM | POA: Diagnosis not present

## 2021-09-13 DIAGNOSIS — L408 Other psoriasis: Secondary | ICD-10-CM | POA: Diagnosis not present

## 2021-09-13 DIAGNOSIS — L409 Psoriasis, unspecified: Secondary | ICD-10-CM | POA: Diagnosis not present

## 2021-09-13 DIAGNOSIS — Z5181 Encounter for therapeutic drug level monitoring: Secondary | ICD-10-CM | POA: Diagnosis not present

## 2021-09-15 ENCOUNTER — Ambulatory Visit
Admission: EM | Admit: 2021-09-15 | Discharge: 2021-09-15 | Disposition: A | Discharge status: Home / Self Care | Payer: Medicare HMO

## 2021-09-15 DIAGNOSIS — H6121 Impacted cerumen, right ear: Secondary | ICD-10-CM

## 2021-09-15 MED ORDER — CARBAMIDE PEROXIDE 6.5 % OT SOLN: 5.0000 [drp] | Freq: Two times a day (BID) | OTIC | 0 refills | Status: AC | PRN

## 2021-09-15 NOTE — ED Triage Notes (Signed)
Patient presents to Urgent Care with complaints of right ear fullness x 3 weeks. Self-treated by washing out with peroxide no changes.

## 2021-09-15 NOTE — ED Provider Notes (Signed)
[name]    CSN: [id number] Arrival date & time: 09/15/21  1301      History   Chief Complaint Chief Complaint  Patient presents with   Ear Fullness    HPI Robert Andersen is a 61 y.o. male.   HPI Patient presents with right ear fullness x 3 weeks. He used peroxide to clear out his right ear however he continues to feel ear fullness diminished hearing out of the right ear.  He denies any ear pain.  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 Bilateral 2017   Lens replaced   KNEE ARTHROSCOPY WITH MEDIAL MENISECTOMY Left 02/21/2018   Procedure: Left knee arthroscopy, partial medial and lateral menisectomy, debridement;  Surgeon: Susa Day, MD;  Location: [facility];  Service: Orthopedics;  Laterality: Left;  60 mins   REFRACTIVE SURGERY         Home Medications    Prior to Admission medications   Medication Sig Start Date End Date Taking? Authorizing Provider  carbamide peroxide (DEBROX) 6.5 % OTIC solution Place 5 drops into both ears 2 (two) times daily as needed (Every 5 days as needed). 09/15/21  Yes Scot Jun, FNP  hydrOXYzine (ATARAX) 25 MG tablet Take by mouth. 05/19/21  Yes [provider]  triamcinolone ointment (KENALOG) 0.1 % Apply to the affected  areas of the body twice daily 06/14/21  Yes [provider]  ALPRAZolam (XANAX) 1 MG tablet TAKE 1 TABLET BY MOUTH DAILY AS NEEDED FOR STRESS 05/14/15   [provider]  aspirin 81 MG tablet Take 81 mg by mouth daily.    [provider]  folic acid (FOLVITE) 1 MG tablet Take 1 mg by mouth daily. 08/20/21   [provider]  gabapentin (NEURONTIN) 300 MG capsule Take 300 mg by mouth 3 (three) times daily.    [provider]  hydrocortisone 2.5 % ointment 2 (two) times daily. 08/09/21   [provider]  methotrexate (RHEUMATREX) 2.5 MG tablet Take 15 mg by mouth once a week. 09/10/21   [provider]  Multiple Vitamin (MULTIVITAMIN) capsule Take 1 capsule by mouth daily.    [provider]  naproxen sodium (ALEVE) 220 MG tablet Take 220 mg by mouth.    [provider]  oxyCODONE-acetaminophen (PERCOCET) 5-325 MG tablet Take 1 tablet by mouth every 4 (four) hours as needed for severe pain. 02/21/18   Susa Day, MD  prednisoLONE acetate (PRED FORTE) 1 % ophthalmic suspension 1 drop daily. 05/11/21   [provider]  predniSONE (DELTASONE) 50 MG tablet One tablet a day 05/04/21   Fransico Meadow, PA-C  pseudoephedrine (SUDAFED) 60  MG tablet Take 120 mg by mouth every 4 (four) hours as needed for congestion.    [provider]  zolpidem (AMBIEN) 10 MG tablet Take 10 mg by mouth at bedtime as needed. 08/26/21   [provider]    Family History Family History  Problem Relation Age of Onset   Colon cancer Mother    Heart attack Father    Diabetes Father    Healthy Son     Social History Social History   Tobacco Use   Smoking status: Never   Smokeless tobacco: Never  Vaping Use   Vaping Use: Never used  Substance Use Topics   Alcohol use: No    Alcohol/week: 0.0 standard drinks of alcohol   Drug use: No     Allergies   Meloxicam, Shrimp (diagnostic), and Shrimp extract allergy skin  test   Review of Systems Review of Systems Pertinent negatives listed in HPI   Physical Exam Triage Vital Signs ED Triage Vitals [09/15/21 1321]  Enc Vitals Group     BP 128/80     Pulse Rate 93     Resp 18     Temp 98.2 F (36.8 C)     Temp Source Temporal     SpO2 96 %     Weight      Height      Head Circumference      Peak Flow      Pain Score 0     Pain Loc      Pain Edu?      Excl. in [place]?    No data found.  Updated Vital Signs BP 128/80 (BP Location: Left Arm)   Pulse 93   Temp 98.2 F (36.8 C) (Temporal)   Resp 18   SpO2 96%   Visual Acuity Right Eye Distance:   Left Eye Distance:   Bilateral Distance:    Right Eye Near:   Left Eye Near:    Bilateral Near:     Physical Exam Constitutional:      Appearance: Normal appearance.  HENT:     Right Ear: There is impacted cerumen.     Left Ear: There is no impacted cerumen.  Eyes:     Extraocular Movements: Extraocular movements intact.     Pupils: Pupils are equal, round, and reactive to light.  Cardiovascular:     Rate and Rhythm: Normal rate and regular rhythm.  Pulmonary:     Effort: Pulmonary effort is normal.     Breath sounds: Normal breath sounds.  Musculoskeletal:        General: Normal range of motion.     Cervical back: Normal range of motion.  Skin:    Capillary Refill: Capillary refill takes less than 2 seconds.  Neurological:     Mental Status: He is alert and oriented to person, place, and time. Mental status is at baseline.    UC Treatments / Results  Labs (all labs ordered are listed, but only abnormal results are displayed) Labs Reviewed - No data to display  EKG   Radiology No results found.  Procedures Procedures (including critical care time)  Medications Ordered in UC Medications - No data to display  Initial Impression / Assessment and Plan / UC Course  I have reviewed the triage vital signs and the nursing notes.  Pertinent labs & imaging results that were  available during my care of the patient were reviewed by me and considered in my medical decision making (see  chart for details).    Impacted cerumen of right ear Nurse Performed Ear Lavage  Debrox drops PRN to prevent and manage increased cerumen. Cerumen successfully removed  Strict return precautions given if symptoms reoccur. Final Clinical Impressions(s) / UC Diagnoses   Final diagnoses:  Impacted cerumen of right ear   Discharge Instructions   None    ED Prescriptions     Medication Sig Dispense Auth. Provider   carbamide peroxide (DEBROX) 6.5 % OTIC solution Place 5 drops into both ears 2 (two) times daily as needed (Every 5 days as needed). 15 mL Scot Jun, FNP      PDMP not reviewed this encounter.   Scot Jun, FNP 09/15/21 1441

## 2021-10-23 DIAGNOSIS — H6123 Impacted cerumen, bilateral: Secondary | ICD-10-CM | POA: Diagnosis not present

## 2021-10-23 DIAGNOSIS — L409 Psoriasis, unspecified: Secondary | ICD-10-CM | POA: Diagnosis not present

## 2021-10-23 DIAGNOSIS — Z79899 Other long term (current) drug therapy: Secondary | ICD-10-CM | POA: Diagnosis not present

## 2021-10-23 DIAGNOSIS — I499 Cardiac arrhythmia, unspecified: Secondary | ICD-10-CM | POA: Diagnosis not present

## 2021-10-23 DIAGNOSIS — Z5181 Encounter for therapeutic drug level monitoring: Secondary | ICD-10-CM | POA: Diagnosis not present

## 2021-10-25 DIAGNOSIS — L408 Other psoriasis: Secondary | ICD-10-CM | POA: Diagnosis not present

## 2021-10-25 DIAGNOSIS — Z5181 Encounter for therapeutic drug level monitoring: Secondary | ICD-10-CM | POA: Diagnosis not present

## 2021-11-22 DIAGNOSIS — L539 Erythematous condition, unspecified: Secondary | ICD-10-CM | POA: Diagnosis not present

## 2021-12-20 DIAGNOSIS — Z5181 Encounter for therapeutic drug level monitoring: Secondary | ICD-10-CM | POA: Diagnosis not present

## 2021-12-20 DIAGNOSIS — L539 Erythematous condition, unspecified: Secondary | ICD-10-CM | POA: Diagnosis not present

## 2022-01-02 DIAGNOSIS — M5416 Radiculopathy, lumbar region: Secondary | ICD-10-CM | POA: Diagnosis not present

## 2022-01-02 DIAGNOSIS — F419 Anxiety disorder, unspecified: Secondary | ICD-10-CM | POA: Diagnosis not present

## 2022-01-02 DIAGNOSIS — M25562 Pain in left knee: Secondary | ICD-10-CM | POA: Diagnosis not present

## 2022-01-02 DIAGNOSIS — G4700 Insomnia, unspecified: Secondary | ICD-10-CM | POA: Diagnosis not present

## 2022-01-02 DIAGNOSIS — N529 Male erectile dysfunction, unspecified: Secondary | ICD-10-CM | POA: Diagnosis not present

## 2022-01-02 DIAGNOSIS — E782 Mixed hyperlipidemia: Secondary | ICD-10-CM | POA: Diagnosis not present

## 2022-01-02 DIAGNOSIS — Z8659 Personal history of other mental and behavioral disorders: Secondary | ICD-10-CM | POA: Diagnosis not present

## 2022-01-02 DIAGNOSIS — G5602 Carpal tunnel syndrome, left upper limb: Secondary | ICD-10-CM | POA: Diagnosis not present

## 2022-01-02 DIAGNOSIS — L309 Dermatitis, unspecified: Secondary | ICD-10-CM | POA: Diagnosis not present

## 2022-01-17 DIAGNOSIS — L409 Psoriasis, unspecified: Secondary | ICD-10-CM | POA: Diagnosis not present

## 2022-01-17 DIAGNOSIS — Z5181 Encounter for therapeutic drug level monitoring: Secondary | ICD-10-CM | POA: Diagnosis not present

## 2022-03-14 DIAGNOSIS — L539 Erythematous condition, unspecified: Secondary | ICD-10-CM | POA: Diagnosis not present

## 2022-03-14 DIAGNOSIS — L409 Psoriasis, unspecified: Secondary | ICD-10-CM | POA: Diagnosis not present

## 2022-05-29 DIAGNOSIS — H33323 Round hole, bilateral: Secondary | ICD-10-CM | POA: Diagnosis not present

## 2022-05-29 DIAGNOSIS — H04123 Dry eye syndrome of bilateral lacrimal glands: Secondary | ICD-10-CM | POA: Diagnosis not present

## 2022-05-29 DIAGNOSIS — H43813 Vitreous degeneration, bilateral: Secondary | ICD-10-CM | POA: Diagnosis not present

## 2022-05-29 DIAGNOSIS — H3581 Retinal edema: Secondary | ICD-10-CM | POA: Diagnosis not present

## 2022-06-01 ENCOUNTER — Encounter (INDEPENDENT_AMBULATORY_CARE_PROVIDER_SITE_OTHER): Payer: Medicare Other | Admitting: Ophthalmology

## 2022-06-01 DIAGNOSIS — H43813 Vitreous degeneration, bilateral: Secondary | ICD-10-CM | POA: Diagnosis not present

## 2022-06-01 DIAGNOSIS — H33303 Unspecified retinal break, bilateral: Secondary | ICD-10-CM

## 2022-06-15 ENCOUNTER — Encounter (INDEPENDENT_AMBULATORY_CARE_PROVIDER_SITE_OTHER): Payer: Medicare Other | Admitting: Ophthalmology

## 2022-06-15 DIAGNOSIS — H33303 Unspecified retinal break, bilateral: Secondary | ICD-10-CM | POA: Diagnosis not present

## 2022-06-15 DIAGNOSIS — H43813 Vitreous degeneration, bilateral: Secondary | ICD-10-CM

## 2022-06-20 DIAGNOSIS — Z5181 Encounter for therapeutic drug level monitoring: Secondary | ICD-10-CM | POA: Diagnosis not present

## 2022-06-20 DIAGNOSIS — L409 Psoriasis, unspecified: Secondary | ICD-10-CM | POA: Diagnosis not present

## 2022-07-24 DIAGNOSIS — Z0000 Encounter for general adult medical examination without abnormal findings: Secondary | ICD-10-CM | POA: Diagnosis not present

## 2022-07-24 DIAGNOSIS — E782 Mixed hyperlipidemia: Secondary | ICD-10-CM | POA: Diagnosis not present

## 2022-07-24 DIAGNOSIS — F419 Anxiety disorder, unspecified: Secondary | ICD-10-CM | POA: Diagnosis not present

## 2022-07-24 DIAGNOSIS — D84821 Immunodeficiency due to drugs: Secondary | ICD-10-CM | POA: Diagnosis not present

## 2022-07-24 DIAGNOSIS — M25562 Pain in left knee: Secondary | ICD-10-CM | POA: Diagnosis not present

## 2022-07-24 DIAGNOSIS — G4700 Insomnia, unspecified: Secondary | ICD-10-CM | POA: Diagnosis not present

## 2022-07-24 DIAGNOSIS — L309 Dermatitis, unspecified: Secondary | ICD-10-CM | POA: Diagnosis not present

## 2022-07-24 DIAGNOSIS — Z1159 Encounter for screening for other viral diseases: Secondary | ICD-10-CM | POA: Diagnosis not present

## 2022-07-24 DIAGNOSIS — Z125 Encounter for screening for malignant neoplasm of prostate: Secondary | ICD-10-CM | POA: Diagnosis not present

## 2022-10-15 ENCOUNTER — Encounter (INDEPENDENT_AMBULATORY_CARE_PROVIDER_SITE_OTHER): Payer: Medicare Other | Admitting: Ophthalmology

## 2022-10-15 DIAGNOSIS — H33303 Unspecified retinal break, bilateral: Secondary | ICD-10-CM

## 2022-10-15 DIAGNOSIS — H43813 Vitreous degeneration, bilateral: Secondary | ICD-10-CM

## 2022-10-16 ENCOUNTER — Encounter (INDEPENDENT_AMBULATORY_CARE_PROVIDER_SITE_OTHER): Payer: Medicare Other | Admitting: Ophthalmology

## 2022-10-24 DIAGNOSIS — Z5181 Encounter for therapeutic drug level monitoring: Secondary | ICD-10-CM | POA: Diagnosis not present

## 2022-10-24 DIAGNOSIS — L409 Psoriasis, unspecified: Secondary | ICD-10-CM | POA: Diagnosis not present

## 2022-12-11 DIAGNOSIS — N529 Male erectile dysfunction, unspecified: Secondary | ICD-10-CM | POA: Diagnosis not present

## 2022-12-11 DIAGNOSIS — M25562 Pain in left knee: Secondary | ICD-10-CM | POA: Diagnosis not present

## 2022-12-11 DIAGNOSIS — Z79899 Other long term (current) drug therapy: Secondary | ICD-10-CM | POA: Diagnosis not present

## 2022-12-11 DIAGNOSIS — F419 Anxiety disorder, unspecified: Secondary | ICD-10-CM | POA: Diagnosis not present

## 2022-12-11 DIAGNOSIS — G4700 Insomnia, unspecified: Secondary | ICD-10-CM | POA: Diagnosis not present

## 2022-12-11 DIAGNOSIS — E782 Mixed hyperlipidemia: Secondary | ICD-10-CM | POA: Diagnosis not present

## 2023-03-20 DIAGNOSIS — L409 Psoriasis, unspecified: Secondary | ICD-10-CM | POA: Diagnosis not present

## 2023-03-20 DIAGNOSIS — Z5181 Encounter for therapeutic drug level monitoring: Secondary | ICD-10-CM | POA: Diagnosis not present

## 2023-06-03 DIAGNOSIS — H33323 Round hole, bilateral: Secondary | ICD-10-CM | POA: Diagnosis not present

## 2023-06-03 DIAGNOSIS — Z961 Presence of intraocular lens: Secondary | ICD-10-CM | POA: Diagnosis not present

## 2023-06-03 DIAGNOSIS — H43813 Vitreous degeneration, bilateral: Secondary | ICD-10-CM | POA: Diagnosis not present

## 2023-06-03 DIAGNOSIS — H04123 Dry eye syndrome of bilateral lacrimal glands: Secondary | ICD-10-CM | POA: Diagnosis not present

## 2023-06-25 DIAGNOSIS — Z0000 Encounter for general adult medical examination without abnormal findings: Secondary | ICD-10-CM | POA: Diagnosis not present

## 2023-06-25 DIAGNOSIS — Z125 Encounter for screening for malignant neoplasm of prostate: Secondary | ICD-10-CM | POA: Diagnosis not present

## 2023-06-25 DIAGNOSIS — E782 Mixed hyperlipidemia: Secondary | ICD-10-CM | POA: Diagnosis not present

## 2023-06-25 DIAGNOSIS — M25562 Pain in left knee: Secondary | ICD-10-CM | POA: Diagnosis not present

## 2023-06-25 DIAGNOSIS — F419 Anxiety disorder, unspecified: Secondary | ICD-10-CM | POA: Diagnosis not present

## 2023-06-25 DIAGNOSIS — G4700 Insomnia, unspecified: Secondary | ICD-10-CM | POA: Diagnosis not present

## 2023-09-18 DIAGNOSIS — D485 Neoplasm of uncertain behavior of skin: Secondary | ICD-10-CM | POA: Diagnosis not present

## 2023-09-18 DIAGNOSIS — L821 Other seborrheic keratosis: Secondary | ICD-10-CM | POA: Diagnosis not present

## 2023-09-18 DIAGNOSIS — L308 Other specified dermatitis: Secondary | ICD-10-CM | POA: Diagnosis not present

## 2023-09-18 DIAGNOSIS — Z5181 Encounter for therapeutic drug level monitoring: Secondary | ICD-10-CM | POA: Diagnosis not present

## 2023-09-18 DIAGNOSIS — L409 Psoriasis, unspecified: Secondary | ICD-10-CM | POA: Diagnosis not present

## 2023-12-12 DIAGNOSIS — M25562 Pain in left knee: Secondary | ICD-10-CM | POA: Diagnosis not present

## 2023-12-12 DIAGNOSIS — G4700 Insomnia, unspecified: Secondary | ICD-10-CM | POA: Diagnosis not present

## 2023-12-12 DIAGNOSIS — F419 Anxiety disorder, unspecified: Secondary | ICD-10-CM | POA: Diagnosis not present

## 2023-12-12 DIAGNOSIS — E782 Mixed hyperlipidemia: Secondary | ICD-10-CM | POA: Diagnosis not present

## 2023-12-12 DIAGNOSIS — L539 Erythematous condition, unspecified: Secondary | ICD-10-CM | POA: Diagnosis not present

## 2024-01-29 DIAGNOSIS — L409 Psoriasis, unspecified: Secondary | ICD-10-CM | POA: Diagnosis not present
